# Patient Record
Sex: Male | Born: 1985 | Race: White | Hispanic: No | Smoking: Former smoker
Health system: Southern US, Community
[De-identification: ages and names within clinical notes are randomized; demographics above are authoritative.]

## PROBLEM LIST (undated history)

## (undated) DIAGNOSIS — K219 Gastro-esophageal reflux disease without esophagitis: Secondary | ICD-10-CM

## (undated) DIAGNOSIS — F419 Anxiety disorder, unspecified: Secondary | ICD-10-CM

## (undated) DIAGNOSIS — F32A Depression, unspecified: Secondary | ICD-10-CM

## (undated) DIAGNOSIS — F329 Major depressive disorder, single episode, unspecified: Secondary | ICD-10-CM

## (undated) DIAGNOSIS — F1410 Cocaine abuse, uncomplicated: Secondary | ICD-10-CM

## (undated) DIAGNOSIS — F909 Attention-deficit hyperactivity disorder, unspecified type: Secondary | ICD-10-CM

## (undated) HISTORY — PX: TONSILLECTOMY: SUR1361

---
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
## 2007-08-17 ENCOUNTER — Ambulatory Visit: Payer: Self-pay | Admitting: Otolaryngology

## 2010-12-29 ENCOUNTER — Emergency Department: Payer: Self-pay | Admitting: Emergency Medicine

## 2011-01-21 ENCOUNTER — Ambulatory Visit: Payer: Self-pay | Admitting: Gastroenterology

## 2011-01-22 LAB — PATHOLOGY REPORT

## 2011-08-04 ENCOUNTER — Other Ambulatory Visit: Payer: Self-pay | Admitting: Pediatrics

## 2011-08-04 LAB — HEPATIC FUNCTION PANEL A (ARMC)
Alkaline Phosphatase: 87 U/L (ref 50–136)
Bilirubin, Direct: <0.1 mg/dL (ref 0.00–0.20)
SGOT(AST): 33 U/L (ref 15–37)
SGPT (ALT): 51 U/L
Total Protein: 7.3 g/dL (ref 6.4–8.2)

## 2015-10-21 ENCOUNTER — Ambulatory Visit: Payer: Self-pay | Admitting: Family Medicine

## 2016-09-06 ENCOUNTER — Emergency Department
Admission: EM | Admit: 2016-09-06 | Discharge: 2016-09-07 | Disposition: A | Discharge status: Home / Self Care | Payer: Medicaid Other | Attending: Emergency Medicine | Admitting: Emergency Medicine

## 2016-09-06 DIAGNOSIS — F1410 Cocaine abuse, uncomplicated: Secondary | ICD-10-CM | POA: Diagnosis not present

## 2016-09-06 DIAGNOSIS — Z5181 Encounter for therapeutic drug level monitoring: Secondary | ICD-10-CM | POA: Diagnosis not present

## 2016-09-06 DIAGNOSIS — F17200 Nicotine dependence, unspecified, uncomplicated: Secondary | ICD-10-CM | POA: Diagnosis not present

## 2016-09-06 DIAGNOSIS — R45851 Suicidal ideations: Secondary | ICD-10-CM | POA: Diagnosis present

## 2016-09-06 HISTORY — DX: Cocaine abuse, uncomplicated: F14.10

## 2016-09-06 LAB — COMPREHENSIVE METABOLIC PANEL
ALBUMIN: 5.2 g/dL — AB (ref 3.5–5.0)
ALT: 31 U/L (ref 17–63)
ANION GAP: 11 (ref 5–15)
AST: 26 U/L (ref 15–41)
Alkaline Phosphatase: 85 U/L (ref 38–126)
BUN: 16 mg/dL (ref 6–20)
CHLORIDE: 100 mmol/L — AB (ref 101–111)
CO2: 24 mmol/L (ref 22–32)
Calcium: 9.7 mg/dL (ref 8.9–10.3)
Creatinine, Ser: 1.15 mg/dL (ref 0.61–1.24)
GFR calc non Af Amer: >60 mL/min (ref >60)
GLUCOSE: 100 mg/dL — AB (ref 65–99)
Potassium: 3.4 mmol/L — ABNORMAL LOW (ref 3.5–5.1)
SODIUM: 135 mmol/L (ref 135–145)
Total Bilirubin: 1.1 mg/dL (ref 0.3–1.2)
Total Protein: 8.3 g/dL — ABNORMAL HIGH (ref 6.5–8.1)

## 2016-09-06 LAB — CBC
HEMATOCRIT: 45.1 % (ref 40.0–52.0)
Hemoglobin: 15.9 g/dL (ref 13.0–18.0)
MCH: 29.2 pg (ref 26.0–34.0)
MCHC: 35.3 g/dL (ref 32.0–36.0)
MCV: 82.8 fL (ref 80.0–100.0)
Platelets: 304 10*3/uL (ref 150–440)
RBC: 5.44 MIL/uL (ref 4.40–5.90)
RDW: 13.4 % (ref 11.5–14.5)
WBC: 10.4 10*3/uL (ref 3.8–10.6)

## 2016-09-06 LAB — URINE DRUG SCREEN, QUALITATIVE (ARMC ONLY)
AMPHETAMINES, UR SCREEN: NOT DETECTED
Barbiturates, Ur Screen: NOT DETECTED
Benzodiazepine, Ur Scrn: NOT DETECTED
COCAINE METABOLITE, UR ~~LOC~~: POSITIVE — AB
Cannabinoid 50 Ng, Ur ~~LOC~~: NOT DETECTED
MDMA (ECSTASY) UR SCREEN: NOT DETECTED
METHADONE SCREEN, URINE: NOT DETECTED
Opiate, Ur Screen: NOT DETECTED
Phencyclidine (PCP) Ur S: NOT DETECTED
TRICYCLIC, UR SCREEN: NOT DETECTED

## 2016-09-06 LAB — ETHANOL: Alcohol, Ethyl (B): <5 mg/dL (ref <5)

## 2016-09-06 LAB — SALICYLATE LEVEL

## 2016-09-06 LAB — ACETAMINOPHEN LEVEL

## 2016-09-06 NOTE — BH Assessment (Signed)
Per EDP (Dr.Forbach) pt will be discharged and does not need TTS Consult.   Clinician spoke with pt and provided resources per EDP request.   Pt presents requesting cocaine detox services. Pt reports passive SI w/o intent and access. Pt denies h/o suicide attempt. Pt reports thoughts of self-harm with no intent. Pt denies h/o self-harm. Pt denies HI. Pt reports no hallucinations and does not appear to be responding to internal stimuli. Pt does not appear to be experiencing delusional thought content.  Pt aware of [facility] services. Pt states he has court date and is unable to admit self to [facility] until after court.  Pt denies alcohol use. Pt denies additional substance use. Pt aware of lack of inpatient detox services for sole cocaine use. Pt provided with information and resources for outpatient SA services, including [facility] intensive outpatient services.

## 2016-09-06 NOTE — ED Triage Notes (Signed)
Pt arrives to ER via POV for detox from cocaine. Pt reports that he was at [facility] to get help and that would not allow him because he has a court date coming up. PT last use of cocaine was "a couple hours ago". Denies any other drugs or alcohol use. Pt reports thoughts of harming himself, but denies HI. No hx or prior attempts of SI.

## 2016-09-06 NOTE — ED Notes (Signed)
Pt in hallway at this time.

## 2016-09-06 NOTE — ED Provider Notes (Signed)
[facility] Emergency Department Provider Note  ____________________________________________   First MD Initiated Contact with Patient 09/06/16 2211     (approximate)  I have reviewed the triage vital signs and the nursing notes.   HISTORY  Chief Complaint Drug Problem and Suicidal    HPI Benjamin Crane. is a 31 y.o. male with a history of cocaine abuse who presents asking for help with detox from cocaine.  He states that his last usage was a few hours ago.  He says that it has been gradually worsening over the last few weeks but that he has had a problem for a long time.  It is affecting his life and he is losing his family.  He reports depression but denies any suicidal ideation and denies homicidal ideation.  He states that he tried to go to [facility] but they would not take him because he has a court date coming up in a couple of days and said that they would not take him until afterwards.  He feels like he does not have control over his habit.  He denies any recent illnesses or symptoms as documented in the review of systems below.  He states his problem is severe and nothing is helping.   Past Medical History:  Diagnosis Date  . Cocaine abuse     There are no active problems to display for this patient.   History reviewed. No pertinent surgical history.  Prior to Admission medications   Not on File    Allergies Patient has no known allergies.  History reviewed. No pertinent family history.  Social History Social History  Substance Use Topics  . Smoking status: Current Every Day Smoker  . Smokeless tobacco: Never Used  . Alcohol use No    Review of Systems Constitutional: No fever/chills Eyes: No visual changes. ENT: No sore throat. Cardiovascular: Denies chest pain. Respiratory: Denies shortness of breath. Gastrointestinal: No abdominal pain.  No nausea, no vomiting.  No diarrhea.  No constipation. Genitourinary: Negative for  dysuria. Musculoskeletal: Negative for back pain. Skin: Negative for rash. Neurological: Negative for headaches, focal weakness or numbness. Psych:  depressed due to his cocaine abuse, but denies SI/HI  10-point ROS otherwise negative.  ____________________________________________   PHYSICAL EXAM:  VITAL SIGNS: ED Triage Vitals  Enc Vitals Group     BP 09/06/16 2040 123/87     Pulse Rate 09/06/16 2040 75     Resp 09/06/16 2040 18     Temp 09/06/16 2040 98.1 F (36.7 C)     Temp Source 09/06/16 2040 Oral     SpO2 09/06/16 2147 100 %     Weight 09/06/16 2040 160 lb (72.6 kg)     Height 09/06/16 2040 5\' 10"  (1.778 m)     Head Circumference --      Peak Flow --      Pain Score --      Pain Loc --      Pain Edu? --      Excl. in GC? --     Constitutional: Alert and oriented. Disheveled but generally well-appearing Eyes: Conjunctivae are normal. PERRL. EOMI. Head: Atraumatic. Nose: No congestion/rhinnorhea. Mouth/Throat: Mucous membranes are moist. Neck: No stridor.  No meningeal signs.   Cardiovascular: Normal rate, regular rhythm. Good peripheral circulation. Grossly normal heart sounds. Respiratory: Normal respiratory effort.  No retractions. Lungs CTAB. Gastrointestinal: Soft and nontender. No distention.  Musculoskeletal: No lower extremity tenderness nor edema. No gross deformities of extremities.  Neurologic:  Normal speech and language. No gross focal neurologic deficits are appreciated.  Skin:  Skin is warm, dry and intact. No rash noted. Psychiatric: Mood and affect are normal. Speech and behavior are normal. Denies SI/HI  ____________________________________________   LABS (all labs ordered are listed, but only abnormal results are displayed)  Labs Reviewed  COMPREHENSIVE METABOLIC PANEL - Abnormal; Notable for the following:       Result Value   Potassium 3.4 (*)    Chloride 100 (*)    Glucose, Bld 100 (*)    Total Protein 8.3 (*)    Albumin 5.2 (*)     All other components within normal limits  ACETAMINOPHEN LEVEL - Abnormal; Notable for the following:    Acetaminophen (Tylenol), Serum <10 (*)    All other components within normal limits  URINE DRUG SCREEN, QUALITATIVE ([facility] ONLY) - Abnormal; Notable for the following:    Cocaine Metabolite,Ur [name] POSITIVE (*)    All other components within normal limits  ETHANOL  SALICYLATE LEVEL  CBC   ____________________________________________  EKG  None - EKG not ordered by ED physician ____________________________________________  RADIOLOGY   No results found.  ____________________________________________   PROCEDURES  Procedure(s) performed:   Procedures   Critical Care performed: No ____________________________________________   INITIAL IMPRESSION / ASSESSMENT AND PLAN / ED COURSE  Pertinent labs & imaging results that were available during my care of the patient were reviewed by me and considered in my medical decision making (see chart for details).  The patient is denying suicidal ideation and homicidal ideation on 2 different occasions during the interview in which I ask him.  He admits that he has a problem and has good insight into it.  He does not seem to have the ability to control himself and wants some sort of inpatient treatment for assistance.  I explained that [facility] is where we usually send people for help with his kind of problem.  I asked Margaretmary LombardFatima with [facility] to come see the patient and offer some recommendations and outpatient options.  There is no indication for inpatient treatment or involuntary commitment at this time, and he does not represent an acute danger to himself nor others.      ____________________________________________  FINAL CLINICAL IMPRESSION(S) / ED DIAGNOSES  Final diagnoses:  Cocaine abuse     MEDICATIONS GIVEN DURING THIS VISIT:  Medications - No data to display   NEW OUTPATIENT MEDICATIONS STARTED DURING THIS VISIT:  New  Prescriptions   No medications on file    Modified Medications   No medications on file    Discontinued Medications   No medications on file     Note:  This document was prepared using Dragon voice recognition software and may include unintentional dictation errors.    Loleta Roseory Jahmeek Shirk, MD 09/06/16 2258

## 2016-09-06 NOTE — Discharge Instructions (Signed)
You have been seen in the Emergency Department (ED) today for substance abuse.  We do not treat cocaine abuse in this hospital, but encourage you to follow up with [facility] (as you already tried to do) or even with [facility] who can help with mental health services.  You were also given additional community resources such as in [place] [facility] as well as intensive [facility] resources [facility].  We encourage you to seek additional treatment with those options.  Please return to the ED immediately if you have ANY thoughts of hurting yourself or anyone else, so that we may help you.  Please avoid alcohol and drug use.  Follow up with your doctor and/or therapist as soon as possible regarding today's ED  visit.   Please follow up any other recommendations and clinic appointments provided by the psychiatry team that saw you in the Emergency Department.

## 2016-09-06 NOTE — ED Notes (Addendum)
Pt given phone to call ride, due to D/C. Pt given Estate manager/land agentresource materials for behavioral and detox services. Pt provided personal belongings in front of ODS officer. Pt changed out of purple scrubs and into personal clothing. Will D/C once ride is at ED.

## 2016-09-22 ENCOUNTER — Emergency Department
Admission: EM | Admit: 2016-09-22 | Discharge: 2016-09-23 | Disposition: A | Discharge status: Home / Self Care | Payer: Medicaid Other | Attending: Student in an Organized Health Care Education/Training Program | Admitting: Student in an Organized Health Care Education/Training Program

## 2016-09-22 ENCOUNTER — Encounter: Payer: Self-pay | Admitting: Emergency Medicine

## 2016-09-22 DIAGNOSIS — F22 Delusional disorders: Secondary | ICD-10-CM | POA: Diagnosis not present

## 2016-09-22 DIAGNOSIS — F918 Other conduct disorders: Secondary | ICD-10-CM | POA: Insufficient documentation

## 2016-09-22 DIAGNOSIS — F15950 Other stimulant use, unspecified with stimulant-induced psychotic disorder with delusions: Secondary | ICD-10-CM

## 2016-09-22 DIAGNOSIS — R45850 Homicidal ideations: Secondary | ICD-10-CM

## 2016-09-22 DIAGNOSIS — F1520 Other stimulant dependence, uncomplicated: Secondary | ICD-10-CM

## 2016-09-22 DIAGNOSIS — F1410 Cocaine abuse, uncomplicated: Secondary | ICD-10-CM

## 2016-09-22 DIAGNOSIS — F1910 Other psychoactive substance abuse, uncomplicated: Secondary | ICD-10-CM

## 2016-09-22 DIAGNOSIS — F17200 Nicotine dependence, unspecified, uncomplicated: Secondary | ICD-10-CM | POA: Diagnosis not present

## 2016-09-22 DIAGNOSIS — R4689 Other symptoms and signs involving appearance and behavior: Secondary | ICD-10-CM

## 2016-09-22 DIAGNOSIS — Z79899 Other long term (current) drug therapy: Secondary | ICD-10-CM | POA: Diagnosis not present

## 2016-09-22 DIAGNOSIS — F1510 Other stimulant abuse, uncomplicated: Secondary | ICD-10-CM

## 2016-09-22 LAB — COMPREHENSIVE METABOLIC PANEL
ALT: 25 U/L (ref 17–63)
AST: 26 U/L (ref 15–41)
Albumin: 5.2 g/dL — ABNORMAL HIGH (ref 3.5–5.0)
Alkaline Phosphatase: 87 U/L (ref 38–126)
Anion gap: 11 (ref 5–15)
BILIRUBIN TOTAL: 1.1 mg/dL (ref 0.3–1.2)
BUN: 21 mg/dL — AB (ref 6–20)
CO2: 23 mmol/L (ref 22–32)
CREATININE: 1.23 mg/dL (ref 0.61–1.24)
Calcium: 9.8 mg/dL (ref 8.9–10.3)
Chloride: 104 mmol/L (ref 101–111)
GFR calc Af Amer: >60 mL/min (ref >60)
Glucose, Bld: 107 mg/dL — ABNORMAL HIGH (ref 65–99)
POTASSIUM: 3.1 mmol/L — AB (ref 3.5–5.1)
Sodium: 138 mmol/L (ref 135–145)
TOTAL PROTEIN: 8.3 g/dL — AB (ref 6.5–8.1)

## 2016-09-22 LAB — CBC
HCT: 47.3 % (ref 40.0–52.0)
Hemoglobin: 16.1 g/dL (ref 13.0–18.0)
MCH: 28.4 pg (ref 26.0–34.0)
MCHC: 34 g/dL (ref 32.0–36.0)
MCV: 83.7 fL (ref 80.0–100.0)
PLATELETS: 258 10*3/uL (ref 150–440)
RBC: 5.66 MIL/uL (ref 4.40–5.90)
RDW: 12.9 % (ref 11.5–14.5)
WBC: 11.2 10*3/uL — AB (ref 3.8–10.6)

## 2016-09-22 LAB — ETHANOL

## 2016-09-22 LAB — ACETAMINOPHEN LEVEL: Acetaminophen (Tylenol), Serum: <10 ug/mL — ABNORMAL LOW (ref 10–30)

## 2016-09-22 LAB — SALICYLATE LEVEL: Salicylate Lvl: <7 mg/dL (ref 2.8–30.0)

## 2016-09-22 MED ORDER — LORAZEPAM 2 MG PO TABS
2.0000 mg | ORAL_TABLET | Freq: Once | ORAL | Status: AC
  Administered 2016-09-22: 2 mg via ORAL
  Filled 2016-09-22: qty 1

## 2016-09-22 MED ORDER — LORAZEPAM 1 MG PO TABS
1.0000 mg | ORAL_TABLET | ORAL | Status: DC | PRN
  Administered 2016-09-23: 1 mg via ORAL
  Filled 2016-09-22: qty 1

## 2016-09-22 MED ORDER — HALOPERIDOL 5 MG PO TABS
5.0000 mg | ORAL_TABLET | Freq: Once | ORAL | Status: AC
  Administered 2016-09-22: 5 mg via ORAL
  Filled 2016-09-22: qty 1

## 2016-09-22 NOTE — ED Notes (Signed)
Gave pt food tray. 

## 2016-09-22 NOTE — ED Notes (Signed)
Report given to Johny SaxEric Jenson RN, pt walked over with police officer and Ed tech

## 2016-09-22 NOTE — ED Triage Notes (Signed)
[facility].  Patient's wife got IVC papers on him due to his wife stating he was hallucinating.  Patient recently released from a halfway house after he had gone from rehab.  Patient states he has recently taking methamphetamines.  Patient's wife states he facebook chatted with her through video where the patient states he saw another man in the house with her named "Fletcher".  The patient later showed up at her house with as gun and started searching for "fletcher" in the house.  Wife and others were present to verify that there was no other person in the house.

## 2016-09-22 NOTE — ED Notes (Signed)
Pt is alert but somewhat disoriented on admission. Pt is aware of person place and situation but environment is ever changing. Pt sees movement and bizarre shapes in environment. Pt affect is also bizarre and he is disorganized as well. Pt unable to distinguish reality, but he is pleasant and cooperative with staff. Writer oriented pt to the milieu and encouraged rest. Writer also suggested that perpetrator in home may have been a hallucination brought on by lack of sleep and methamphetamine abuse. Pt does not disagree, but cannot think rationally. Writer provided food and drink and 15 minute checks ongoing for safety. He went to sleep shortly after arrival, and stated he had been up for 4 days without rest.

## 2016-09-22 NOTE — BH Assessment (Signed)
Clinician informed by Minerva AreolaEric, RN that pt is currently asleep. RN requested clinician not wake pt, explaining that pt reported lack of sleep x4 days and that it is not in the pt's best interest at this time. Pt to be assessed at later time.

## 2016-09-22 NOTE — ED Provider Notes (Signed)
[facility] Emergency Department Provider Note    First MD Initiated Contact with Patient 09/22/16 1719     (approximate)  I have reviewed the triage vital signs and the nursing notes.   HISTORY  Chief Complaint Homicidal and Hallucinations    HPI Benjamin Crane. is a 31 y.o. male who presents via shifted PEs with IVC due to paranoia and homicidal ideation. Patient provides very long history about today's events regarding his worry that his wife is currently a prostitute and sleeping with multiple other men but one has been in his house. States that the name and his pain is "Benjamin Crane "and that he was on his bed with his wife while the patient was talking to his wife via Facebook video. States that throughout the day became more anxious drug to the house to find them. So known in the house. Talk to his wife who states that there is no notes in the house. He states that when the wife was driving away he thinks that he saw someone climbing over the seats and getting in the passenger seat.  He then thinks that later today when he is again talking to his wife that he heard male voice in the house, he grabbed his gun and was walking around the room searching for him because he wanted to "get him "   Past Medical History:  Diagnosis Date  . Cocaine abuse    No family history on file. Past Surgical History:  Procedure Laterality Date  . TONSILLECTOMY     There are no active problems to display for this patient.     Prior to Admission medications   Not on File    Allergies Patient has no known allergies.    Social History Social History  Substance Use Topics  . Smoking status: Current Every Day Smoker  . Smokeless tobacco: Never Used  . Alcohol use No    Review of Systems Patient denies headaches, rhinorrhea, blurry vision, numbness, shortness of breath, chest pain, edema, cough, abdominal pain, nausea, vomiting, diarrhea, dysuria, fevers,  rashes or hallucinations unless otherwise stated above in HPI. ____________________________________________   PHYSICAL EXAM:  VITAL SIGNS: Vitals:   09/22/16 1659  BP: (!) 140/99  Pulse: (!) 116  Resp: 20  Temp: 98 F (36.7 C)    Constitutional: Alert agitated and anxious, pacing about room Eyes: Conjunctivae are normal. PERRL. EOMI. Head: Atraumatic. Nose: No congestion/rhinnorhea. Mouth/Throat: Mucous membranes are moist.  Oropharynx non-erythematous. Neck: No stridor. Painless ROM. No cervical spine tenderness to palpation Hematological/Lymphatic/Immunilogical: No cervical lymphadenopathy. Cardiovascular: Normal rate, regular rhythm. Grossly normal heart sounds.  Good peripheral circulation. Respiratory: Normal respiratory effort.  No retractions. Lungs CTAB. Gastrointestinal: Soft and nontender. No distention. No abdominal bruits. No CVA tenderness. Musculoskeletal: No lower extremity tenderness nor edema.  No joint effusions. Neurologic:  Normal speech and language. No gross focal neurologic deficits are appreciated. No gait instability. Skin:  Skin is warm, dry and intact. No rash noted. Psychiatric: anxious, oressured speech ____________________________________________   LABS (all labs ordered are listed, but only abnormal results are displayed)  Results for orders placed or performed during the hospital encounter of 09/22/16 (from the past 24 hour(s))  Comprehensive metabolic panel     Status: Abnormal   Collection Time: 09/22/16  5:07 PM  Result Value Ref Range   Sodium 138 135 - 145 mmol/L   Potassium 3.1 (L) 3.5 - 5.1 mmol/L   Chloride 104 101 - 111 mmol/L  CO2 23 22 - 32 mmol/L   Glucose, Bld 107 (H) 65 - 99 mg/dL   BUN 21 (H) 6 - 20 mg/dL   Creatinine, Ser 1.611.23 0.61 - 1.24 mg/dL   Calcium 9.8 8.9 - 09.610.3 mg/dL   Total Protein 8.3 (H) 6.5 - 8.1 g/dL   Albumin 5.2 (H) 3.5 - 5.0 g/dL   AST 26 15 - 41 U/L   ALT 25 17 - 63 U/L   Alkaline Phosphatase 87 38 -  126 U/L   Total Bilirubin 1.1 0.3 - 1.2 mg/dL   GFR calc non Af Amer >60 >60 mL/min   GFR calc Af Amer >60 >60 mL/min   Anion gap 11 5 - 15  Ethanol     Status: None   Collection Time: 09/22/16  5:07 PM  Result Value Ref Range   Alcohol, Ethyl (B) <5 <5 mg/dL  Salicylate level     Status: None   Collection Time: 09/22/16  5:07 PM  Result Value Ref Range   Salicylate Lvl <7.0 2.8 - 30.0 mg/dL  Acetaminophen level     Status: Abnormal   Collection Time: 09/22/16  5:07 PM  Result Value Ref Range   Acetaminophen (Tylenol), Serum <10 (L) 10 - 30 ug/mL  cbc     Status: Abnormal   Collection Time: 09/22/16  5:07 PM  Result Value Ref Range   WBC 11.2 (H) 3.8 - 10.6 K/uL   RBC 5.66 4.40 - 5.90 MIL/uL   Hemoglobin 16.1 13.0 - 18.0 g/dL   HCT 04.547.3 40.940.0 - 81.152.0 %   MCV 83.7 80.0 - 100.0 fL   MCH 28.4 26.0 - 34.0 pg   MCHC 34.0 32.0 - 36.0 g/dL   RDW 91.412.9 78.211.5 - 95.614.5 %   Platelets 258 150 - 440 K/uL   ____________________________________________  EKG____________________________________________  RADIOLOGY   ____________________________________________   PROCEDURES  Procedure(s) performed:  Procedures    Critical Care performed: no ____________________________________________   INITIAL IMPRESSION / ASSESSMENT AND PLAN / ED COURSE  Pertinent labs & imaging results that were available during my care of the patient were reviewed by me and considered in my medical decision making (see chart for details).  DDX: Psychosis, delirium, medication effect, noncompliance, polysubstance abuse, Si, Hi, depression   Benjamin MassonLawrence L Mayor Jr. is a 31 y.o. who presents to the ED with for evaluation of HI and parnaoia.  Patient has psych history of depression and substance abuse.  Laboratory testing was ordered to evaluation for underlying electrolyte derangement or signs of underlying organic pathology to explain today's presentation.  Based on history and physical and laboratory evaluation,  it appears that the patient's presentation is 2/2 underlying psychiatric disorder and will require further evaluation and management by inpatient psychiatry.  Patient was  made an IVC due to paranoia and HI with access to weapons.  Disposition pending psychiatric evaluation.       ____________________________________________   FINAL CLINICAL IMPRESSION(S) / ED DIAGNOSES  Final diagnoses:  Homicidal ideations  Aggressive behavior  Paranoia (HCC)      NEW MEDICATIONS STARTED DURING THIS VISIT:  New Prescriptions   No medications on file     Note:  This document was prepared using Dragon voice recognition software and may include unintentional dictation errors.    Willy EddyPatrick Kyley Laurel, MD 09/22/16 Paulo [id number]

## 2016-09-23 DIAGNOSIS — F1410 Cocaine abuse, uncomplicated: Secondary | ICD-10-CM

## 2016-09-23 DIAGNOSIS — F1510 Other stimulant abuse, uncomplicated: Secondary | ICD-10-CM

## 2016-09-23 DIAGNOSIS — F1520 Other stimulant dependence, uncomplicated: Secondary | ICD-10-CM

## 2016-09-23 DIAGNOSIS — F15950 Other stimulant use, unspecified with stimulant-induced psychotic disorder with delusions: Secondary | ICD-10-CM

## 2016-09-23 NOTE — ED Notes (Signed)
Patient is sleeping, respirations even and unlabored, q 15 minute checks and camera surveillance in progress. q 15 minute , will continue to monitor.

## 2016-09-23 NOTE — BH Assessment (Signed)
Assessment Note  Benjamin Crane. is an 31 y.o. male who presents to the ER due to going to his home, with a gun, because he saw someone in the background while he was "video chatting with her (wife) on FaceBook." According to the ER notes, when he arrived to the home, the wife was there with family members. The individual he thought he had seen wasn't there and family reaffirmed, no one else was there but them.  Patient admits to abusing substances. He reports of abusing "Cocaine and Crystal Meth." His most recent use was several days ago. Due to his use, he hadn't slept in several days. He states, he believes he was seeing things because of the lack of sleep and drug use.  During the interview, he was calm, cooperative and pleasant. He was orient to person, place and situation. He states he is unsure of he have any legal charges, due to the incident that took place last night (09/22/2016). As well as, from a recent "drug binge, I was getting high with an undercover cop."  Past Medical History:  Past Medical History:  Diagnosis Date  . Cocaine abuse     Past Surgical History:  Procedure Laterality Date  . TONSILLECTOMY      Family History: No family history on file.  Social History:  reports that he has been smoking.  He has never used smokeless tobacco. He reports that he uses drugs, including Cocaine and Methamphetamines. He reports that he does not drink alcohol.  Additional Social History:  Alcohol / Drug Use Pain Medications: See PTA Prescriptions: See PTA Over the Counter: See PTA History of alcohol / drug use?: Yes Longest period of sobriety (when/how long): Unable to Quantify  Negative Consequences of Use: Legal, Personal relationships, Financial, Work / School Withdrawal Symptoms:  (n/a) Substance #1 Name of Substance 1: "Crystal Meth" Substance #2 Name of Substance 2: Cocaine  CIWA: CIWA-Ar BP: (!) 99/57 Pulse Rate: 76 COWS:    Allergies: No Known  Allergies  Home Medications:  (Not in a hospital admission)  OB/GYN Status:  No LMP for male patient.  General Assessment Data Location of Assessment: [facility] ED TTS Assessment: In system Is this a Tele or Face-to-Face Assessment?: Face-to-Face Is this an Initial Assessment or a Re-assessment for this encounter?: Initial Assessment Marital status: Married [place] name: n/a Is patient pregnant?: No Pregnancy Status: No Living Arrangements: Spouse/significant other, Other (Comment) (Recently was in [employer]") Can pt return to current living arrangement?: No Admission Status: Involuntary Is patient capable of signing voluntary admission?: No (Under IVC) Referral Source: Self/Family/Friend Insurance type: n/a  Medical Screening Exam [facility] Walk-in ONLY) Medical Exam completed: Yes  Crisis Care Plan Living Arrangements: Spouse/significant other, Other (Comment) (Recently was in "[employer]") Legal Guardian: Other: (Self) Name of Psychiatrist: Reports of none Name of Therapist: Reports of none  Education Status Is patient currently in school?: No Current Grade: n/a Highest grade of school patient has completed: n/a Name of school: n/a Contact person: n/a  Risk to self with the past 6 months Suicidal Ideation: No Has patient been a risk to self within the past 6 months prior to admission? : No Suicidal Intent: No Has patient had any suicidal intent within the past 6 months prior to admission? : No Is patient at risk for suicide?: No Suicidal Plan?: No Has patient had any suicidal plan within the past 6 months prior to admission? : No Access to Means: No What has been your  use of drugs/alcohol within the last 12 months?: Methamphetamine & Cocaine Previous Attempts/Gestures: No How many times?: 0 Other Self Harm Risks: Active addiction Triggers for Past Attempts: None known Intentional Self Injurious Behavior: None Family Suicide History: No Recent stressful life  event(s): Other (Comment) (Active addiction) Persecutory voices/beliefs?: No Depression: Yes Depression Symptoms: Guilt, Fatigue, Feeling worthless/self pity, Insomnia, Isolating Substance abuse history and/or treatment for substance abuse?: Yes Suicide prevention information given to non-admitted patients: Not applicable  Risk to Others within the past 6 months Homicidal Ideation: No Does patient have any lifetime risk of violence toward others beyond the six months prior to admission? : No Thoughts of Harm to Others: No Current Homicidal Intent: No Current Homicidal Plan: No Access to Homicidal Means: No Identified Victim: Reports of none History of harm to others?: No Assessment of Violence: None Noted Violent Behavior Description: Reports of none Does patient have access to weapons?: Yes (Comment) Criminal Charges Pending?: No Does patient have a court date: No Is patient on probation?: No  Psychosis Hallucinations: None noted Delusions: None noted  Mental Status Report Appearance/Hygiene: Unremarkable, In scrubs Eye Contact: Fair Motor Activity: Freedom of movement, Unremarkable Speech: Logical/coherent, Soft, Unremarkable Level of Consciousness: Alert Mood: Anxious, Helpless, Sad, Pleasant Affect: Sad, Appropriate to circumstance, Depressed Anxiety Level: Minimal Thought Processes: Coherent, Relevant Judgement: Unimpaired Orientation: Person, Place, Time, Situation, Appropriate for developmental age Obsessive Compulsive Thoughts/Behaviors: Minimal  Cognitive Functioning Concentration: Normal Memory: Recent Intact, Remote Intact IQ: Average Insight: Fair Impulse Control: Fair Appetite: Fair Weight Loss: 0 Weight Gain: 0 Sleep: Decreased Total Hours of Sleep: 8 Vegetative Symptoms: None  ADLScreening [facility] Assessment Services) Patient's cognitive ability adequate to safely complete daily activities?: Yes Patient able to express need for assistance with  ADLs?: Yes Independently performs ADLs?: Yes (appropriate for developmental age)  Prior Inpatient Therapy Prior Inpatient Therapy: Yes Prior Therapy Dates: Unable to remember the dates [facility] Reason for Treatment: Substance Abuse  Prior Outpatient Therapy Prior Outpatient Therapy: No Prior Therapy Facilty/Provider(s): Reports of none Reason for Treatment: Reports of none Does patient have an ACCT team?: No Does patient have Intensive In-House Services?  : No Does patient have [facility] services? : No Does patient have P4CC services?: No  ADL Screening (condition at time of admission) Patient's cognitive ability adequate to safely complete daily activities?: Yes Is the patient deaf or have difficulty hearing?: No Does the patient have difficulty seeing, even when wearing glasses/contacts?: No Does the patient have difficulty concentrating, remembering, or making decisions?: No Patient able to express need for assistance with ADLs?: Yes Does the patient have difficulty dressing or bathing?: No Independently performs ADLs?: Yes (appropriate for developmental age) Does the patient have difficulty walking or climbing stairs?: No Weakness of Legs: None Weakness of Arms/Hands: None  Home Assistive Devices/Equipment Home Assistive Devices/Equipment: None  Therapy Consults (therapy consults require a physician order) PT Evaluation Needed: No OT Evalulation Needed: No SLP Evaluation Needed: No Abuse/Neglect Assessment (Assessment to be complete while patient is alone) Physical Abuse: Denies Verbal Abuse: Denies Sexual Abuse: Denies Exploitation of patient/patient's resources: Denies Self-Neglect: Denies Values / Beliefs Cultural Requests During Hospitalization: None Spiritual Requests During Hospitalization: None Consults Spiritual Care Consult Needed: No Social Work Consult Needed: No Merchant navy officerAdvance Directives (For Healthcare) Does Patient  Have a Medical Advance Directive?: No    Additional Information 1:1 In Past 12 Months?: No CIRT Risk: No Elopement Risk: No Does patient have medical clearance?: Yes  Child/Adolescent Assessment Running Away  Risk: Denies (Patient is an adult)  Disposition:  Disposition Initial Assessment Completed for this Encounter: Yes Disposition of Patient: Other dispositions (ER MD Ordered Psych Consult)  On Site Evaluation by:   Reviewed with Physician:    Lilyan Gilford MS, LCAS, LPC, NCC, CCSI Therapeutic Triage Specialist 09/23/2016 11:42 AM

## 2016-09-23 NOTE — ED Notes (Signed)
Nurse took breakfast in room and patient grunted and went back to sleep, no evidence of distress.

## 2016-09-23 NOTE — ED Notes (Signed)
Nurse had given patient his lunch tray with a beverage, but he has not touched it, nurse talked to him about eating and drinking and he states that he only wants to sleep, denies Si/hi or avh, nurse let him know that Dr. Toni Amendlapacs would come back and talk to him, q 15 minute checks and camera surveillance in progress.

## 2016-09-23 NOTE — ED Notes (Signed)
Patient with discharge orders, Patient voices understanding of discharge, he has a ride to transfer home, Patient's belongings given back to him. Patient without any signs of distress.

## 2016-09-23 NOTE — Discharge Instructions (Signed)
Please seek medical attention and help for any thoughts about wanting to harm yourself, harm others, any concerning change in behavior, severe depression, inappropriate drug use or any other new or concerning symptoms. ° °

## 2016-09-23 NOTE — ED Notes (Signed)
Dr. Toni Amendclapacs in to see Patient, but Patient does not want to wake up and talk at this time, Dr. Toni Amendlapacs to return. Patient with q 15 minute checks and camera surveillance in progress.

## 2016-09-23 NOTE — ED Notes (Signed)
Pt still exhibiting dystonic movements and having difficulty following simple instructions but he did sleep throughout the night and responds to reinforcement/repetition.

## 2016-09-23 NOTE — ED Notes (Signed)
Dr. Toni Amendlapacs talking to patient.

## 2016-09-23 NOTE — ED Notes (Signed)
Pt almost fell out of bed. Writer woke pt for safety. Pt is disoriented, pacing and jittery, but is redirectable. Writer administered medication and fluids for anxiety/sleep and reoriented pt to time place and situation. Pt responded well to redirection and returned to bed.

## 2016-09-23 NOTE — ED Provider Notes (Signed)
-----------------------------------------   4:34 PM on 09/23/2016 -----------------------------------------  Dr. Toni Amendlapacs with psychiatry has evaluated the patient. He has rescinded the IVC. Feels patient is safe for discharge.   Benjamin SemenGraydon Jocie Meroney, MD 09/23/16 [phone]

## 2016-09-23 NOTE — ED Notes (Signed)
Nurse talked with patient and he admits that he has a problem with drugs and wants to change His way of living, He states " I started doing cocaine when I was 31 years old and I try to stop, but I keep going back to it, talked about his 29 year old daughter and wife. Patient has attended AA, says that he has a job. Patient denies Si/hi and avh, He hopes to go home and start living a new life, states that he has support of family and will continue to go to AA. Nurse also told him about the program [facility] and would like that information. q 15 minute checks and camera surveillance in progress.

## 2016-09-23 NOTE — Consult Note (Signed)
[facility] Consult   Reason for Consult:  Consult for 31 year old man brought in by police after agitated bizarre behavior in the community Referring Physician:  Quale Patient Identification: Benjamin Crane. MRN:  [id number] Principal Diagnosis: Amphetamine and psychostimulant-induced psychosis with delusions ([place]) Diagnosis:   Patient Active Problem List   Diagnosis Date Noted  . Amphetamine and psychostimulant-induced psychosis with delusions ([place]) [F15.950] 09/23/2016  . Amphetamine abuse [F15.10] 09/23/2016  . Cocaine abuse [F14.10] 09/23/2016    Total Time spent with patient: 1 hour  Subjective:   Benjamin Alcorn. is a 31 y.o. male patient admitted with "using drugs and not sleeping".  HPI:  Patient states that he had been on a methamphetamine binge for about 3 days and hadn't slept much since the weekend. Admits that his mood had been somewhat irritable. He has only vague memories of yesterday. Commitment paperwork and police reports state that he had been very agitated according to his wife claiming that she had min in the house that she was sleeping with. He was apparently running around with a gun saying that he was going to find an shoot the man that his wife was with. Wife evidently has claimed there was no other man around at all. On interview today the patient seems to have about halfway insight into this. He understands that he was being a little paranoid and crazy running around although he says he still thinks that his wife had someone in the back seat of her car. Patient admits that his mood gets out of control when he is using drugs. Today he says he is feeling fine. Denies any suicidal or homicidal thoughts at all. Denies any auditory or visual hallucinations. Indicates that he plans to try to get back into the [employer] and continue working. All. Denies any use of alcohol  Social history: He is married but apparently recently has been  staying in an [place] because of his substance abuse behavior. Unclear if he can go back there right now. 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  Medical history: No known significant medical problems  Substance abuse history: He says he's been in a large number of detox and rehabilitation programs of all sorts. Has had drug problems going back years including amphetamine and cocaine.  Past Psychiatric History: Patient says he has never had any kind of psychiatric treatment except for substance abuse although he then says that Dr. Alice Reichert prescribed him Lexapro and buspar. He says he's never been in a psychiatric hospital and has never tried to kill himself  Risk to Self: Suicidal Ideation: No Suicidal Intent: No Is patient at risk for suicide?: No Suicidal Plan?: No Access to Means: No What has been your use of drugs/alcohol within the last 12 months?: Methamphetamine & Cocaine How many times?: 0 Other Self Harm Risks: Active addiction Triggers for Past Attempts: None known Intentional Self Injurious Behavior: None Risk to Others: Homicidal Ideation: No Thoughts of Harm to Others: No Current Homicidal Intent: No Current Homicidal Plan: No Access to Homicidal Means: No Identified Victim: Reports of none History of harm to others?: No Assessment of Violence: None Noted Violent Behavior Description: Reports of none Does patient have access to weapons?: Yes (Comment) Criminal Charges Pending?: No Does patient have a court date: No Prior Inpatient Therapy: Prior Inpatient Therapy: Yes Prior Therapy Dates: Unable to remember the dates [facility] Reason for Treatment: Substance Abuse Prior Outpatient Therapy: Prior Outpatient Therapy: No  Prior Therapy Facilty/Provider(s): Reports of none Reason for Treatment: Reports of none Does patient have an ACCT team?: No Does patient have Intensive In-House Services?  : No Does patient have [facility]  services? : No Does patient have P4CC services?: No  Past Medical History:  Past Medical History:  Diagnosis Date  . Cocaine abuse     Past Surgical History:  Procedure Laterality Date  . TONSILLECTOMY     Family History: No family history on file. Family Psychiatric  History: Denies being aware of any Social History:  History  Alcohol Use No     History  Drug Use  . Types: Cocaine, Methamphetamines    Social History   Social History  . Marital status: Single    Spouse name: N/A  . Number of children: N/A  . Years of education: N/A   Social History Main Topics  . Smoking status: Current Every Day Smoker  . Smokeless tobacco: Never Used  . Alcohol use No  . Drug use: Yes    Types: Cocaine, Methamphetamines  . Sexual activity: Not Asked   Other Topics Concern  . None   Social History Narrative  . None   Additional Social History:    Allergies:  No Known Allergies  Labs:  Results for orders placed or performed during the hospital encounter of 09/22/16 (from the past 48 hour(s))  Comprehensive metabolic panel     Status: Abnormal   Collection Time: 09/22/16  5:07 PM  Result Value Ref Range   Sodium 138 135 - 145 mmol/L   Potassium 3.1 (L) 3.5 - 5.1 mmol/L   Chloride 104 101 - 111 mmol/L   CO2 23 22 - 32 mmol/L   Glucose, Bld 107 (H) 65 - 99 mg/dL   BUN 21 (H) 6 - 20 mg/dL   Creatinine, Ser 1.23 0.61 - 1.24 mg/dL   Calcium 9.8 8.9 - 10.3 mg/dL   Total Protein 8.3 (H) 6.5 - 8.1 g/dL   Albumin 5.2 (H) 3.5 - 5.0 g/dL   AST 26 15 - 41 U/L   ALT 25 17 - 63 U/L   Alkaline Phosphatase 87 38 - 126 U/L   Total Bilirubin 1.1 0.3 - 1.2 mg/dL   GFR calc non Af Amer >60 >60 mL/min   GFR calc Af Amer >60 >60 mL/min    Comment: (NOTE) The eGFR has been calculated using the CKD EPI equation. This calculation has not been validated in all clinical situations. eGFR's persistently <60 mL/min signify possible Chronic Kidney Disease.    Anion gap 11 5 - 15  Ethanol      Status: None   Collection Time: 09/22/16  5:07 PM  Result Value Ref Range   Alcohol, Ethyl (B) <5 <5 mg/dL    Comment:        LOWEST DETECTABLE LIMIT FOR SERUM ALCOHOL IS 5 mg/dL FOR MEDICAL PURPOSES ONLY   Salicylate level     Status: None   Collection Time: 09/22/16  5:07 PM  Result Value Ref Range   Salicylate Lvl <1.0 2.8 - 30.0 mg/dL  Acetaminophen level     Status: Abnormal   Collection Time: 09/22/16  5:07 PM  Result Value Ref Range   Acetaminophen (Tylenol), Serum <10 (L) 10 - 30 ug/mL    Comment:        THERAPEUTIC CONCENTRATIONS VARY SIGNIFICANTLY. A RANGE OF 10-30 ug/mL MAY BE AN EFFECTIVE CONCENTRATION FOR MANY PATIENTS. HOWEVER, SOME ARE BEST TREATED AT CONCENTRATIONS OUTSIDE THIS RANGE. ACETAMINOPHEN  CONCENTRATIONS >150 ug/mL AT 4 HOURS AFTER INGESTION AND >50 ug/mL AT 12 HOURS AFTER INGESTION ARE OFTEN ASSOCIATED WITH TOXIC REACTIONS.   cbc     Status: Abnormal   Collection Time: 09/22/16  5:07 PM  Result Value Ref Range   WBC 11.2 (H) 3.8 - 10.6 K/uL   RBC 5.66 4.40 - 5.90 MIL/uL   Hemoglobin 16.1 13.0 - 18.0 g/dL   HCT 47.3 40.0 - 52.0 %   MCV 83.7 80.0 - 100.0 fL   MCH 28.4 26.0 - 34.0 pg   MCHC 34.0 32.0 - 36.0 g/dL   RDW 12.9 11.5 - 14.5 %   Platelets 258 150 - 440 K/uL    Current Facility-Administered Medications  Medication Dose Route Frequency Provider Last Rate Last Dose  . LORazepam (ATIVAN) tablet 1 mg  1 mg Oral Q4H PRN Merlyn Lot, MD   1 mg at 09/23/16 4098   Current Outpatient Prescriptions  Medication Sig Dispense Refill  . busPIRone (BUSPAR) 15 MG tablet Take 15 mg by mouth 2 (two) times daily.    Marland Kitchen escitalopram (LEXAPRO) 20 MG tablet Take 20 mg by mouth daily.    . pantoprazole (PROTONIX) 40 MG tablet Take 40 mg by mouth daily.      Musculoskeletal: Strength & Muscle Tone: within normal limits Gait & Station: normal Patient leans: N/A  Psychiatric Specialty Exam: Physical Exam  Nursing note and vitals  reviewed. Constitutional: He appears well-developed and well-nourished.  HENT:  Head: Normocephalic and atraumatic.  Eyes: Conjunctivae are normal. Pupils are equal, round, and reactive to light.  Neck: Normal range of motion.  Cardiovascular: Regular rhythm and normal heart sounds.   Respiratory: Effort normal. No respiratory distress.  GI: Soft.  Musculoskeletal: Normal range of motion.  Neurological: He is alert.  Skin: Skin is warm and dry.  Psychiatric: He has a normal mood and affect. His speech is normal and behavior is normal. Judgment and thought content normal. Cognition and memory are normal.    Review of Systems  Constitutional: Negative.   HENT: Negative.   Eyes: Negative.   Respiratory: Negative.   Cardiovascular: Negative.   Gastrointestinal: Negative.   Musculoskeletal: Negative.   Skin: Negative.   Neurological: Negative.   Psychiatric/Behavioral: Positive for substance abuse. Negative for depression, hallucinations, memory loss and suicidal ideas. The patient has insomnia. The patient is not nervous/anxious.     Blood pressure (!) 99/57, pulse 76, temperature 97.5 F (36.4 C), temperature source Oral, resp. rate 18, height 5' 10"  (1.778 m), weight 72.6 kg (160 lb), SpO2 99 %.Body mass index is 22.96 kg/m.  General Appearance: Fairly Groomed  Eye Contact:  Fair  Speech:  Clear and Coherent  Volume:  Normal  Mood:  Euthymic  Affect:  Appropriate  Thought Process:  Goal Directed  Orientation:  Full (Time, Place, and Person)  Thought Content:  Logical  Suicidal Thoughts:  No  Homicidal Thoughts:  No  Memory:  Immediate;   Good Recent;   Fair Remote;   Fair  Judgement:  Fair  Insight:  Fair  Psychomotor Activity:  Normal  Concentration:  Concentration: Fair  Recall:  [employer] of Knowledge:  Fair  Language:  Fair  Akathisia:  No  Handed:  Right  AIMS (if indicated):     Assets:  Communication Skills Housing Physical Health  ADL's:  Intact   Cognition:  WNL  Sleep:        Treatment Plan Summary: Plan 31 year old man with amphetamine and  cocaine abuse who apparently was having a drug-induced psychosis yesterday. He has calm down and does not appear to be acutely agitated today. Not threatening or suicidal. Other than still thinking his wife had another guy and her car he is not acting bizarre or paranoid. Asian does not require inpatient hospital level treatment. He will be given information about local substance abuse facilities and educated about the dangers of continued use of stimulants. IVC can be discontinued. Case reviewed with the ER doctor and TTS.  Disposition: Patient does not meet criteria for psychiatric inpatient admission. Supportive therapy provided about ongoing stressors.  Alethia Berthold, MD 09/23/2016 3:08 PM

## 2016-11-09 ENCOUNTER — Encounter: Payer: Self-pay | Admitting: Emergency Medicine

## 2016-11-09 ENCOUNTER — Emergency Department
Admission: EM | Admit: 2016-11-09 | Discharge: 2016-11-09 | Disposition: A | Discharge status: Home / Self Care | Payer: Medicaid Other | Attending: Emergency Medicine | Admitting: Emergency Medicine

## 2016-11-09 ENCOUNTER — Emergency Department: Payer: Medicaid Other

## 2016-11-09 DIAGNOSIS — N399 Disorder of urinary system, unspecified: Secondary | ICD-10-CM

## 2016-11-09 DIAGNOSIS — M545 Low back pain, unspecified: Secondary | ICD-10-CM

## 2016-11-09 DIAGNOSIS — F17200 Nicotine dependence, unspecified, uncomplicated: Secondary | ICD-10-CM | POA: Diagnosis not present

## 2016-11-09 DIAGNOSIS — R1030 Lower abdominal pain, unspecified: Secondary | ICD-10-CM | POA: Diagnosis present

## 2016-11-09 LAB — URINALYSIS, COMPLETE (UACMP) WITH MICROSCOPIC
BACTERIA UA: NONE SEEN
BILIRUBIN URINE: NEGATIVE
Glucose, UA: NEGATIVE mg/dL
HGB URINE DIPSTICK: NEGATIVE
Ketones, ur: NEGATIVE mg/dL
LEUKOCYTES UA: NEGATIVE
Nitrite: NEGATIVE
PROTEIN: NEGATIVE mg/dL
Specific Gravity, Urine: 1.013 (ref 1.005–1.030)
WBC UA: NONE SEEN WBC/hpf (ref 0–5)
pH: 7 (ref 5.0–8.0)

## 2016-11-09 LAB — CHLAMYDIA/NGC RT PCR (ARMC ONLY)
Chlamydia Tr: NOT DETECTED
N gonorrhoeae: NOT DETECTED

## 2016-11-09 MED ORDER — TAMSULOSIN HCL 0.4 MG PO CAPS: 0.4000 mg | ORAL_CAPSULE | Freq: Every day | ORAL | 0 refills | Status: DC

## 2016-11-09 NOTE — ED Notes (Signed)
Called lab about adding on chlamydia testing

## 2016-11-09 NOTE — ED Notes (Signed)
Patient reports halting urination today and lower back pain radiating into his groin for several days.  Patient is in no obvious distress at this time.

## 2016-11-09 NOTE — ED Triage Notes (Signed)
Pt to ed with c/o back pain and urinary frequency, burning, and pain.

## 2016-11-09 NOTE — ED Notes (Signed)
Administered bladder scan. 1st attempt , 2nd - .  Patient emptied bladder stated urine came out in dribbles at first then a very slow stream. Scanned emptied bladder. 1st 6 ml, 2nd- 97ml. AS

## 2016-11-10 NOTE — ED Provider Notes (Signed)
[facility] Emergency Department Provider Note  ____________________________________________  Time seen: Approximately 12:17 AM  I have reviewed the triage vital signs and the nursing notes.   HISTORY  Chief Complaint Back Pain    HPI Benjamin Crane. is a 31 y.o. male that presents to the emergency department with low back pain and difficulty peeing for 2 days. Patient states the pain begins on both sides of back and radiates around both sides. Patient states that he feels like he has difficulty initiating urination and it doesn't completely empty his bladder. He does not feel like he has to go. Patient has drank 3 bottles of water today. This has never happened before. No history of a hernia or kidney stone. Patient is concerned for his prostate. Patient is married and is not concerned for an STD. He denies fever, night sweats, SOB, CP, nausea, vomiting, abdominal pain, dysuria, penile discharge, testicular pain, bowel or bladder incontinence, saddle paresthesias, rash, weight loss.    Past Medical History:  Diagnosis Date  . Cocaine abuse     Patient Active Problem List   Diagnosis Date Noted  . 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 Cocaine abuse 09/23/2016    Past Surgical History:  Procedure Laterality Date  . TONSILLECTOMY      Prior to Admission medications   Medication Sig Start Date End Date Taking? Authorizing Provider  busPIRone (BUSPAR) 15 MG tablet Take 15 mg by mouth 2 (two) times daily.    Historical Provider, MD  escitalopram (LEXAPRO) 20 MG tablet Take 20 mg by mouth daily.    Historical Provider, MD  pantoprazole (PROTONIX) 40 MG tablet Take 40 mg by mouth daily.    Historical Provider, MD  tamsulosin (FLOMAX) 0.4 MG CAPS capsule Take 1 capsule (0.4 mg total) by mouth daily. 11/09/16   Enid Derry, PA-C    Allergies Patient has no known  allergies.  History reviewed. No pertinent family history.  Social History Social History  Substance Use Topics  . Smoking status: Current Every Day Smoker  . Smokeless tobacco: Never Used  . Alcohol use No     Review of Systems  Constitutional: No fever/chills Cardiovascular: No chest pain. Respiratory: No SOB. Gastrointestinal: No abdominal pain.  No nausea, no vomiting.  Musculoskeletal: Negative for musculoskeletal pain. Skin: Negative for rash, abrasions, lacerations, ecchymosis. Neurological: Negative for headaches, numbness or tingling   ____________________________________________   PHYSICAL EXAM:  VITAL SIGNS: ED Triage Vitals  Enc Vitals Group     BP 11/09/16 1810 120/73     Pulse Rate 11/09/16 1810 89     Resp 11/09/16 1810 18     Temp 11/09/16 1810 98.2 F (36.8 C)     Temp Source 11/09/16 1810 Oral     SpO2 11/09/16 1810 99 %     Weight 11/09/16 1811 160 lb (72.6 kg)     Height --      Head Circumference --      Peak Flow --      Pain Score 11/09/16 1810 0     Pain Loc --      Pain Edu? --      Excl. in GC? --      Constitutional: Alert and oriented. Well appearing and in no acute distress. Eyes: Conjunctivae are normal. PERRL. EOMI. Head: Atraumatic. ENT:      Ears:      Nose: No congestion/rhinnorhea.  Mouth/Throat: Mucous membranes are moist.  Neck: No stridor.   Cardiovascular: Normal rate, regular rhythm.  Good peripheral circulation. Respiratory: Normal respiratory effort without tachypnea or retractions. Lungs CTAB. Good air entry to the bases with no decreased or absent breath sounds. Gastrointestinal: Bowel sounds 4 quadrants. Soft and nontender to palpation. No guarding or rigidity. No palpable masses. No distention. No CVA tenderness. Musculoskeletal: Full range of motion to all extremities. No gross deformities appreciated. No tenderness to palpation over thoracic or lumbar spine. Neurologic:  Normal speech and language. No  gross focal neurologic deficits are appreciated.  Skin:  Skin is warm, dry and intact. No rash noted.   ____________________________________________   LABS (all labs ordered are listed, but only abnormal results are displayed)  Labs Reviewed  URINALYSIS, COMPLETE (UACMP) WITH MICROSCOPIC - Abnormal; Notable for the following:       Result Value   Color, Urine YELLOW (*)    APPearance CLEAR (*)    Squamous Epithelial / LPF 0-5 (*)    All other components within normal limits  CHLAMYDIA/NGC RT PCR ([facility] ONLY)   ____________________________________________  EKG   ____________________________________________  RADIOLOGY Lexine Baton, personally viewed and evaluated these images (plain radiographs) as part of my medical decision making, as well as reviewing the written report by the radiologist.  Ct Renal Stone Study  Result Date: 11/09/2016 CLINICAL DATA:  Flank and low back pain radiating to the groin for several days. EXAM: CT ABDOMEN AND PELVIS WITHOUT CONTRAST TECHNIQUE: Multidetector CT imaging of the abdomen and pelvis was performed following the standard protocol without IV contrast. COMPARISON:  12/29/2010 CT abdomen/pelvis. FINDINGS: Lower chest: No significant pulmonary nodules or acute consolidative airspace disease. Hepatobiliary: Normal liver with no liver mass. Normal gallbladder with no radiopaque cholelithiasis. No biliary ductal dilatation. Pancreas: Normal, with no mass or duct dilation. Spleen: Normal size. No mass. Adrenals/Urinary Tract: Normal adrenals. No renal stones. No hydronephrosis. No contour deforming renal mass. Normal caliber ureters, with no ureteral stones. Normal bladder, with no gallbladder wall thickening or bladder stones . Stomach/Bowel: Grossly normal stomach. Normal caliber small bowel with no small bowel wall thickening. Normal appendix. Normal large bowel with no diverticulosis, large bowel wall thickening or pericolonic fat stranding.  Vascular/Lymphatic: Normal caliber abdominal aorta. No pathologically enlarged lymph nodes in the abdomen or pelvis. Reproductive: Normal size prostate. Other: No pneumoperitoneum, ascites or focal fluid collection. Musculoskeletal: No aggressive appearing focal osseous lesions. Small sclerotic lesion in the medial right acetabulum is stable since 12/29/2010, compatible with a benign bone island. IMPRESSION: No acute abnormality. No urolithiasis. No evidence of urinary tract obstruction. No evidence of bowel obstruction or acute bowel inflammation. Normal appendix. Electronically Signed   By: Delbert Phenix M.D.   On: 11/09/2016 20:52    ____________________________________________    PROCEDURES  Procedure(s) performed:    Procedures    Medications - No data to display   ____________________________________________   INITIAL IMPRESSION / ASSESSMENT AND PLAN / ED COURSE  Pertinent labs & imaging results that were available during my care of the patient were reviewed by me and considered in my medical decision making (see chart for details).  Review of the [place] CSRS was performed in accordance of the [place] prior to dispensing any controlled drugs.   He presented to the emergency department with difficulty urinating for 2 days. Vital signs and exam are reassuring. No abdominal pain, penile pain, testicular pain. Back is nontender to palpation. No systemic symptoms. No indication of infection  on urinalysis. No kidney stone on CT. Gonorrhea and chlamydia are negative. When giving urine sample patient states he was not having any difficulty urinating at that time. Post void bladder scan shows of urine. Patient deferred rectal exam and states that he will to call his PCP tomorrow to have rectal exam completed and PSA drawn. Patient will be discharged home with prescriptions for a short course of Flomax. Patient will be referred to urology. Patient is given ED precautions to return to the ED  for any worsening or new symptoms.     ____________________________________________  FINAL CLINICAL IMPRESSION(S) / ED DIAGNOSES  Final diagnoses:  Acute bilateral low back pain without sciatica  Urinary disorder      NEW MEDICATIONS STARTED DURING THIS VISIT:  Discharge Medication List as of 11/09/2016  9:50 PM    START taking these medications   Details  tamsulosin (FLOMAX) 0.4 MG CAPS capsule Take 1 capsule (0.4 mg total) by mouth daily., Starting Tue 11/09/2016, Print            This chart was dictated using voice recognition software/Dragon. Despite best efforts to proofread, errors can occur which can change the meaning. Any change was purely unintentional.    Enid Derry, PA-C 11/10/16 1316    Phineas Semen, MD 11/10/16 1538

## 2018-03-27 ENCOUNTER — Encounter: Payer: Self-pay | Admitting: Emergency Medicine

## 2018-03-27 ENCOUNTER — Other Ambulatory Visit: Payer: Self-pay

## 2018-03-27 ENCOUNTER — Emergency Department
Admission: EM | Admit: 2018-03-27 | Discharge: 2018-03-27 | Disposition: A | Discharge status: Home / Self Care | Payer: Self-pay | Attending: Student in an Organized Health Care Education/Training Program | Admitting: Student in an Organized Health Care Education/Training Program

## 2018-03-27 DIAGNOSIS — Z23 Encounter for immunization: Secondary | ICD-10-CM | POA: Insufficient documentation

## 2018-03-27 DIAGNOSIS — Z79899 Other long term (current) drug therapy: Secondary | ICD-10-CM | POA: Insufficient documentation

## 2018-03-27 DIAGNOSIS — Y92812 Truck as the place of occurrence of the external cause: Secondary | ICD-10-CM | POA: Insufficient documentation

## 2018-03-27 DIAGNOSIS — F17200 Nicotine dependence, unspecified, uncomplicated: Secondary | ICD-10-CM | POA: Insufficient documentation

## 2018-03-27 DIAGNOSIS — L089 Local infection of the skin and subcutaneous tissue, unspecified: Secondary | ICD-10-CM | POA: Insufficient documentation

## 2018-03-27 DIAGNOSIS — Y998 Other external cause status: Secondary | ICD-10-CM | POA: Insufficient documentation

## 2018-03-27 DIAGNOSIS — L03011 Cellulitis of right finger: Secondary | ICD-10-CM | POA: Insufficient documentation

## 2018-03-27 DIAGNOSIS — Y9389 Activity, other specified: Secondary | ICD-10-CM | POA: Insufficient documentation

## 2018-03-27 DIAGNOSIS — S60460A Insect bite (nonvenomous) of right index finger, initial encounter: Secondary | ICD-10-CM | POA: Insufficient documentation

## 2018-03-27 DIAGNOSIS — W57XXXA Bitten or stung by nonvenomous insect and other nonvenomous arthropods, initial encounter: Secondary | ICD-10-CM | POA: Insufficient documentation

## 2018-03-27 MED ORDER — CLINDAMYCIN HCL 300 MG PO CAPS: 300.0000 mg | ORAL_CAPSULE | Freq: Four times a day (QID) | ORAL | 0 refills | Status: AC

## 2018-03-27 MED ORDER — CLINDAMYCIN PHOSPHATE 300 MG/2ML IJ SOLN
INTRAMUSCULAR | Status: AC
  Filled 2018-03-27: qty 4

## 2018-03-27 MED ORDER — TETANUS-DIPHTH-ACELL PERTUSSIS 5-2.5-18.5 LF-MCG/0.5 IM SUSP
0.5000 mL | Freq: Once | INTRAMUSCULAR | Status: AC
  Administered 2018-03-27: 0.5 mL via INTRAMUSCULAR
  Filled 2018-03-27: qty 0.5

## 2018-03-27 MED ORDER — CLINDAMYCIN PHOSPHATE 600 MG/4ML IJ SOLN
600.0000 mg | Freq: Once | INTRAMUSCULAR | Status: AC
  Administered 2018-03-27: 600 mg via INTRAMUSCULAR
  Filled 2018-03-27 (×2): qty 4

## 2018-03-27 NOTE — ED Notes (Signed)
See triage note. Pt presents to ED s/p bite to base of right pointer finger - area reddened with center pustule. Denies fevers. ABCs intact. NAD

## 2018-03-27 NOTE — ED Provider Notes (Signed)
[facility] Emergency Department Provider Note  ____________________________________________  Time seen: Approximately 5:31 PM  I have reviewed the triage vital signs and the nursing notes.   HISTORY  Chief Complaint Insect Bite    HPI Benjamin Crane. is a 32 y.o. male that presents emergency department for evaluation of infection to right finger after insect bite yesterday.  Patient reached into open his truck and was bitten by something.  Finger started swelling this morning.  No fever, chills.   Past Medical History:  Diagnosis Date  . Cocaine abuse [facility])     Patient Active Problem List   Diagnosis Date Noted  . 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 Cocaine abuse (HCC) 09/23/2016    Past Surgical History:  Procedure Laterality Date  . TONSILLECTOMY      Prior to Admission medications   Medication Sig Start Date End Date Taking? Authorizing Provider  busPIRone (BUSPAR) 15 MG tablet Take 15 mg by mouth 2 (two) times daily.    [provider]  clindamycin (CLEOCIN) 300 MG capsule Take 1 capsule (300 mg total) by mouth 4 (four) times daily for 10 days. 03/27/18 04/06/18  Enid Derry, PA-C  escitalopram (LEXAPRO) 20 MG tablet Take 20 mg by mouth daily.    [provider]  pantoprazole (PROTONIX) 40 MG tablet Take 40 mg by mouth daily.    [provider]  tamsulosin (FLOMAX) 0.4 MG CAPS capsule Take 1 capsule (0.4 mg total) by mouth daily. 11/09/16   Enid Derry, PA-C    Allergies Patient has no known allergies.  No family history on file.  Social History Social History   Tobacco Use  . Smoking status: Current Every Day Smoker  . Smokeless tobacco: Never Used  Substance Use Topics  . Alcohol use: No  . Drug use: No     Review of Systems  Constitutional: No fever/chills Respiratory: No SOB. Gastrointestinal: No  nausea, no vomiting.  Musculoskeletal: Positive for finger pain. Skin: Negative for abrasions, lacerations, ecchymosis.  Positive for rash.   ____________________________________________   PHYSICAL EXAM:  VITAL SIGNS: ED Triage Vitals  Enc Vitals Group     BP 03/27/18 1632 (!) 148/98     Pulse Rate 03/27/18 1632 (!) 110     Resp 03/27/18 1632 (!) 8     Temp 03/27/18 1632 98.2 F (36.8 C)     Temp Source 03/27/18 1632 Oral     SpO2 03/27/18 1632 98 %     Weight 03/27/18 1634 150 lb (68 kg)     Height 03/27/18 1634 5\' 10"  (1.778 m)     Head Circumference --      Peak Flow --      Pain Score 03/27/18 1634 6     Pain Loc --      Pain Edu? --      Excl. in GC? --      Constitutional: Alert and oriented. Well appearing and in no acute distress. Eyes: Conjunctivae are normal. PERRL. EOMI. Head: Atraumatic. ENT:      Ears:      Nose: No congestion/rhinnorhea.      Mouth/Throat: Mucous membranes are moist.  Neck: No stridor.   Cardiovascular: Normal rate, regular rhythm.  Good peripheral circulation. Respiratory: Normal respiratory effort without tachypnea or retractions. Lungs CTAB. Good air entry to the bases with no decreased or absent breath sounds. Musculoskeletal: Full range of motion to all  extremities. No gross deformities appreciated.  Full range of motion of finger. Neurologic:  Normal speech and language. No gross focal neurologic deficits are appreciated.  Skin:  Skin is warm, dry.  Pustule to right dorsal proximal index finger with 1 inch of surrounding erythema.Marland Kitchen Psychiatric: Mood and affect are normal. Speech and behavior are normal. Patient exhibits appropriate insight and judgement.   ____________________________________________   LABS (all labs ordered are listed, but only abnormal results are displayed)  Labs Reviewed - No data to  display ____________________________________________  EKG   ____________________________________________  RADIOLOGY  No results found.  ____________________________________________    PROCEDURES  Procedure(s) performed:    Procedures  INCISION AND DRAINAGE Performed by: Enid Derry Consent: Verbal consent obtained. Risks and benefits: risks, benefits and alternatives were discussed Type: pustule  Body area: finger  Anesthesia: none  Incision was made with an 18G needle  Anesthetic total: none  Drainage: purulent  Drainage amount: 1/4 cc  Patient tolerance: Patient tolerated the procedure well with no immediate complications.    Medications  Tdap (BOOSTRIX) injection 0.5 mL (0.5 mLs Intramuscular Given 03/27/18 1750)  clindamycin (CLEOCIN) injection 600 mg (600 mg Intramuscular Given 03/27/18 1750)     ____________________________________________   INITIAL IMPRESSION / ASSESSMENT AND PLAN / ED COURSE  Pertinent labs & imaging results that were available during my care of the patient were reviewed by me and considered in my medical decision making (see chart for details).  Review of the [place] CSRS was performed in accordance of the NCM[facility] prior to dispensing any controlled drugs.  Patient's diagnosis is consistent with pustule and cellulitis following insect bite.  Tetanus shot was updated.  IM clindamycin was given.  Patient will be discharged home with prescriptions for clindamycin. Patient is to follow up with primary care as directed. Patient is given ED precautions to return to the ED for any worsening or new symptoms.  Patient is agreeable to return to the emergency department tomorrow if area of cellulitis worsens.     ____________________________________________  FINAL CLINICAL IMPRESSION(S) / ED DIAGNOSES  Final diagnoses:  Insect bite of right index finger, initial encounter  Pustule  Cellulitis of finger of right hand      NEW MEDICATIONS  STARTED DURING THIS VISIT:  ED Discharge Orders         Ordered    clindamycin (CLEOCIN) 300 MG capsule  4 times daily     03/27/18 1758              This chart was dictated using voice recognition software/Dragon. Despite best efforts to proofread, errors can occur which can change the meaning. Any change was purely unintentional.    Enid Derry, PA-C 03/27/18 2035    Willy Eddy, MD 03/27/18 [phone]

## 2018-03-27 NOTE — ED Triage Notes (Signed)
States was bitten by something yesterday. R index reddened with center pustule.

## 2018-05-05 ENCOUNTER — Encounter: Payer: Self-pay | Admitting: Emergency Medicine

## 2018-05-05 ENCOUNTER — Emergency Department
Admission: EM | Admit: 2018-05-05 | Discharge: 2018-05-06 | Disposition: A | Discharge status: Home / Self Care | Payer: Medicaid Other | Attending: Emergency Medicine | Admitting: Emergency Medicine

## 2018-05-05 ENCOUNTER — Other Ambulatory Visit: Payer: Self-pay

## 2018-05-05 DIAGNOSIS — F17200 Nicotine dependence, unspecified, uncomplicated: Secondary | ICD-10-CM | POA: Insufficient documentation

## 2018-05-05 DIAGNOSIS — Z79899 Other long term (current) drug therapy: Secondary | ICD-10-CM | POA: Insufficient documentation

## 2018-05-05 DIAGNOSIS — F22 Delusional disorders: Secondary | ICD-10-CM | POA: Insufficient documentation

## 2018-05-05 LAB — CBC
HEMATOCRIT: 47.4 % (ref 39.0–52.0)
Hemoglobin: 16.2 g/dL (ref 13.0–17.0)
MCH: 28.9 pg (ref 26.0–34.0)
MCHC: 34.2 g/dL (ref 30.0–36.0)
MCV: 84.6 fL (ref 80.0–100.0)
PLATELETS: 322 10*3/uL (ref 150–400)
RBC: 5.6 MIL/uL (ref 4.22–5.81)
RDW: 12.8 % (ref 11.5–15.5)
WBC: 12.3 10*3/uL — AB (ref 4.0–10.5)
nRBC: 0 % (ref 0.0–0.2)

## 2018-05-05 LAB — COMPREHENSIVE METABOLIC PANEL
ALK PHOS: 88 U/L (ref 38–126)
ALT: 21 U/L (ref 0–44)
AST: 28 U/L (ref 15–41)
Albumin: 5 g/dL (ref 3.5–5.0)
Anion gap: 10 (ref 5–15)
BILIRUBIN TOTAL: 0.9 mg/dL (ref 0.3–1.2)
BUN: 10 mg/dL (ref 6–20)
CALCIUM: 10.4 mg/dL — AB (ref 8.9–10.3)
CO2: 22 mmol/L (ref 22–32)
CREATININE: 1.36 mg/dL — AB (ref 0.61–1.24)
Chloride: 109 mmol/L (ref 98–111)
Glucose, Bld: 116 mg/dL — ABNORMAL HIGH (ref 70–99)
Potassium: 3.7 mmol/L (ref 3.5–5.1)
Sodium: 141 mmol/L (ref 135–145)
TOTAL PROTEIN: 8 g/dL (ref 6.5–8.1)

## 2018-05-05 LAB — ETHANOL

## 2018-05-05 MED ORDER — LORAZEPAM 2 MG/ML IJ SOLN
2.0000 mg | Freq: Once | INTRAMUSCULAR | Status: AC
  Administered 2018-05-05: 2 mg via INTRAMUSCULAR

## 2018-05-05 MED ORDER — LORAZEPAM 2 MG/ML IJ SOLN
INTRAMUSCULAR | Status: AC
  Administered 2018-05-05: 2 mg via INTRAMUSCULAR
  Filled 2018-05-05: qty 1

## 2018-05-05 MED ORDER — HALOPERIDOL LACTATE 5 MG/ML IJ SOLN
INTRAMUSCULAR | Status: AC
  Administered 2018-05-05: 5 mg via INTRAMUSCULAR
  Filled 2018-05-05: qty 1

## 2018-05-05 MED ORDER — HALOPERIDOL LACTATE 5 MG/ML IJ SOLN
5.0000 mg | Freq: Once | INTRAMUSCULAR | Status: AC
  Administered 2018-05-05: 5 mg via INTRAMUSCULAR

## 2018-05-05 NOTE — ED Notes (Signed)
Forensic restraints removed from wrists and ankles.  Pt. Resting comfortably.

## 2018-05-05 NOTE — ED Notes (Signed)
Pt waiting in family wait room with [employer] officers at this time

## 2018-05-05 NOTE — ED Provider Notes (Signed)
[facility] Emergency Department Provider Note ____________________________________________   First MD Initiated Contact with Patient 05/05/18 1951     (approximate)  I have reviewed the triage vital signs and the nursing notes.   HISTORY  Chief Complaint No chief complaint on file.  Level 5 caveat: History of present illness limited due to uncooperative behavior and altered mental status  HPI Shavon Zenz. is a 32 y.o. male with history of substance abuse who presents under IVC for hallucinations.  The patient denies drug use to me today.  He is unable to provide much other history.  Past Medical History:  Diagnosis Date  . Cocaine abuse [facility])     Patient Active Problem List   Diagnosis Date Noted  . 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 Cocaine abuse (HCC) 09/23/2016    Past Surgical History:  Procedure Laterality Date  . TONSILLECTOMY      Prior to Admission medications   Medication Sig Start Date End Date Taking? Authorizing Provider  busPIRone (BUSPAR) 15 MG tablet Take 15 mg by mouth 2 (two) times daily.    [provider]  escitalopram (LEXAPRO) 20 MG tablet Take 20 mg by mouth daily.    [provider]  pantoprazole (PROTONIX) 40 MG tablet Take 40 mg by mouth daily.    [provider]  tamsulosin (FLOMAX) 0.4 MG CAPS capsule Take 1 capsule (0.4 mg total) by mouth daily. 11/09/16   Enid Derry, PA-C    Allergies Patient has no known allergies.  No family history on file.  Social History Social History   Tobacco Use  . Smoking status: Current Every Day Smoker  . Smokeless tobacco: Never Used  Substance Use Topics  . Alcohol use: No  . Drug use: No    Review of Systems Level 5 caveat: Unable to obtain review of systems due to uncooperative behavior and altered mental  status    ____________________________________________   PHYSICAL EXAM:  VITAL SIGNS: ED Triage Vitals  Enc Vitals Group     BP --      Pulse Rate 05/05/18 1842 (!) 110     Resp 05/05/18 1842 18     Temp 05/05/18 1842 98 F (36.7 C)     Temp Source 05/05/18 1842 Oral     SpO2 05/05/18 1842 98 %     Weight --      Height --      Head Circumference --      Peak Flow --      Pain Score 05/05/18 1841 0     Pain Loc --      Pain Edu? --      Excl. in GC? --     Constitutional: Alert, anxious appearing.  Agitated. Eyes: Conjunctivae are normal.  EOMI. Head: Atraumatic. Nose: No congestion/rhinnorhea. Mouth/Throat: Mucous membranes are moist.   Neck: Normal range of motion.  Cardiovascular: Good peripheral circulation. Respiratory: Normal respiratory effort.  Gastrointestinal:  No distention.  Musculoskeletal:  Extremities warm and well perfused.  Neurologic: Motor intact in all extremities. Skin:  Skin is warm and dry. No rash noted. Psychiatric: Agitated, combative, and anxious appearing.  ____________________________________________   LABS (all labs ordered are listed, but only abnormal results are displayed)  Labs Reviewed  COMPREHENSIVE METABOLIC PANEL - Abnormal; Notable for the following components:      Result Value   Glucose, Bld 116 (*)    Creatinine,  Ser 1.36 (*)    Calcium 10.4 (*)    All other components within normal limits  CBC - Abnormal; Notable for the following components:   WBC 12.3 (*)    All other components within normal limits  ETHANOL  URINE DRUG SCREEN, QUALITATIVE ([facility] ONLY)   ____________________________________________  EKG   ____________________________________________  RADIOLOGY    ____________________________________________   PROCEDURES  Procedure(s) performed: No  Procedures  Critical Care performed: No ____________________________________________   INITIAL IMPRESSION / ASSESSMENT AND PLAN / ED  COURSE  Pertinent labs & imaging results that were available during my care of the patient were reviewed by me and considered in my medical decision making (see chart for details).  32 year old male with history of polysubstance abuse and psychosis presents under involuntary commitment with paranoid delusions and erratic behavior.  In the ED, the patient is acutely agitated and combative.  He attempted to run out of the ED.  The patient appears acutely paranoid and is unable to carry on a coherent conversation.  He demonstrates acute danger to self and others, and lacks decision-making capacity to refuse care at this time.  Due to his acute agitation and attempt to run out of the ED, he required sedation.  We will obtain labs for medical clearance, [facility] consultation when the patient is awake, and disposition per psychiatry recommendations.  ----------------------------------------- 11:46 PM on 05/05/2018 -----------------------------------------  Lab work-up is unremarkable.  The patient is medically cleared.  When he is alert and able to cooperate with evaluation, he will be seen by [facility].  I am signing the patient out to the oncoming physician Dr. Dolores Frame. ____________________________________________   FINAL CLINICAL IMPRESSION(S) / ED DIAGNOSES  Final diagnoses:  Delusions (HCC)      NEW MEDICATIONS STARTED DURING THIS VISIT:  New Prescriptions   No medications on file     Note:  This document was prepared using Dragon voice recognition software and may include unintentional dictation errors.    Dionne Bucy, MD 05/05/18 [phone]

## 2018-05-05 NOTE — ED Notes (Signed)
Pt dressing out, belongings into 1 bag and 1 backpack that pt arrived with

## 2018-05-05 NOTE — ED Notes (Signed)
Pt. Brought back to room from family room.  Pt. Paced around room and tried to run past security, security was able to capture patient in quad.  Security had to retrain patient in forensic restraints to wrists and ankles.  MD notified, orders given.

## 2018-05-05 NOTE — ED Notes (Signed)
TTS has attempted to completed a behavioral health assessment. TTS has spoken with pts nurse who report that pt was given an IM medication and is extremely somnolent. Pt was unable to offer information due to altered mental status. Upon presentation pt admits to drug and alcohol use PTA. TTS assessment will be completed when pt is appropria

## 2018-05-05 NOTE — ED Notes (Signed)
Pt dressing out at hti s time

## 2018-05-05 NOTE — ED Notes (Signed)
Spoke with patient, while patient was in restraints.  Pt. Asked "Why did you try to run a way?".  Pt. Did not have answer.  Pt. Asked "have you been drinking alcohol today?"  Pt. Answered "yes".  Pt. Asked, "Have you taken any street drugs today?"  Pt. Answered "yes" to heroin use.  Pt. Asked "Do you want help to get off ETOH and heroin?"  Pt. Indicated "Yes".  Pt. Told "We are here to help, please ask a question or raise any concerns with this nurse

## 2018-05-05 NOTE — ED Triage Notes (Addendum)
PT to ED via [employer] for IVC. Per PD responded to pt residence when pt called out for hallucinations. Per PD when arrived pt began to run away. PT was then detained and IVC for safety. Pt extremely  Fidgety  in triage, calm and cooperartive but paranoid. PT does not respond to [facility] or drug use questioning.

## 2018-05-06 NOTE — BH Assessment (Signed)
TTS made another attempt to complete consult/assessment but patient is too lethargic to participate in the assessment.

## 2018-05-06 NOTE — ED Notes (Signed)
Nurse talked with patient and he states that he would like to see a Child psychotherapist regarding getting back on medicaid, nurse let him know that she would attempt to contact SW. Patient is cooperative and no signs of distress.

## 2018-05-06 NOTE — ED Notes (Signed)
TTS (calvin) talked to Patient and received limited feedback due to patient being sedated/sleepy, no signs of behaviors or distress.

## 2018-05-06 NOTE — ED Provider Notes (Signed)
Cleared for discharge by tele-psychiatry   Jene Every, MD 05/06/18 [phone]

## 2018-05-06 NOTE — ED Notes (Signed)
Patient received lunch tray and beverage. 

## 2018-05-06 NOTE — ED Notes (Signed)

## 2018-05-06 NOTE — ED Notes (Signed)
Nurse went in to check on patient, called his name and he just rolled over, Patient remains sleepy , will continue to monitor. q 15 minute checks and camera surveillance in progress for safety.

## 2018-05-06 NOTE — ED Notes (Signed)
Dr. Jeanene Erb to get report , Dr.Vainranath.

## 2018-05-06 NOTE — ED Notes (Signed)
Supper tray and beverage given to Patient. Patient woke up but went back to sleep.

## 2018-05-06 NOTE — ED Notes (Signed)
Patient received PM snack. 

## 2018-05-06 NOTE — BH Assessment (Signed)
TTS attempted to complete Consult but patient continues to be too lethargic to participate in the assessment.

## 2018-05-06 NOTE — ED Notes (Addendum)
Dr. Jeanene Erb back and states that He is going to release the Patient from the hospital.

## 2018-05-06 NOTE — ED Notes (Addendum)
Patient transferred from quad to room 3, received report from Nicole Cella RN , patient is calm and cooperative, states that He just wants to sleep for now, complained of wrist pain, and nurse ask to see if wrist was swollen and He said " look at it later" , patient is safe, no behavioral issues, no si/hi or avh , will continue to monitor, q 15 minute checks and camera surveillance in progress for safety.

## 2018-05-06 NOTE — ED Provider Notes (Signed)
-----------------------------------------   7:12 AM on 05/06/2018 -----------------------------------------   Pulse (!) 110, temperature 98 F (36.7 C), temperature source Oral, resp. rate 18, SpO2 98 %.  The patient had no acute events since last update.  Calm and cooperative at this time.  Disposition is pending Psychiatry/Behavioral Medicine team recommendations.     Irean Hong, MD 05/06/18 [phone]

## 2018-05-06 NOTE — ED Notes (Signed)
S.O.C. In progress at this time, Patient awake and cooperative.

## 2018-05-07 ENCOUNTER — Emergency Department
Admission: EM | Admit: 2018-05-07 | Discharge: 2018-05-07 | Disposition: A | Discharge status: Home / Self Care | Payer: Medicaid Other | Attending: Emergency Medicine | Admitting: Emergency Medicine

## 2018-05-07 ENCOUNTER — Encounter: Payer: Self-pay | Admitting: Emergency Medicine

## 2018-05-07 ENCOUNTER — Other Ambulatory Visit: Payer: Self-pay

## 2018-05-07 ENCOUNTER — Emergency Department: Payer: Medicaid Other

## 2018-05-07 DIAGNOSIS — Z79899 Other long term (current) drug therapy: Secondary | ICD-10-CM | POA: Insufficient documentation

## 2018-05-07 DIAGNOSIS — F17210 Nicotine dependence, cigarettes, uncomplicated: Secondary | ICD-10-CM | POA: Insufficient documentation

## 2018-05-07 DIAGNOSIS — Y33XXXA Other specified events, undetermined intent, initial encounter: Secondary | ICD-10-CM | POA: Insufficient documentation

## 2018-05-07 DIAGNOSIS — Y998 Other external cause status: Secondary | ICD-10-CM | POA: Insufficient documentation

## 2018-05-07 DIAGNOSIS — Y939 Activity, unspecified: Secondary | ICD-10-CM | POA: Insufficient documentation

## 2018-05-07 DIAGNOSIS — S63502A Unspecified sprain of left wrist, initial encounter: Secondary | ICD-10-CM | POA: Insufficient documentation

## 2018-05-07 DIAGNOSIS — Y929 Unspecified place or not applicable: Secondary | ICD-10-CM | POA: Insufficient documentation

## 2018-05-07 HISTORY — DX: Gastro-esophageal reflux disease without esophagitis: K21.9

## 2018-05-07 HISTORY — DX: Attention-deficit hyperactivity disorder, unspecified type: F90.9

## 2018-05-07 HISTORY — DX: Depression, unspecified: F32.A

## 2018-05-07 HISTORY — DX: Anxiety disorder, unspecified: F41.9

## 2018-05-07 HISTORY — DX: Major depressive disorder, single episode, unspecified: F32.9

## 2018-05-07 NOTE — ED Provider Notes (Signed)
[facility] Emergency Department Provider Note  ____________________________________________   First MD Initiated Contact with Patient 05/07/18 1423     (approximate)  I have reviewed the triage vital signs and the nursing notes.   HISTORY  Chief Complaint Wrist Pain    HPI Jermale Crass. is a 32 y.o. male presents emergency department complaining of left wrist pain.  States that he was here in the hospital tried to leave and resisted arrest.  He said the security officer pulled on the wrist and put his arm in a lock.  He heard a pop in his wrist.  He states it is painful to move.  He denies any other injuries.    Past Medical History:  Diagnosis Date  . ADHD   . Anxiety   . Cocaine abuse (HCC)   . Depression   . GERD (gastroesophageal reflux disease)     Patient Active Problem List   Diagnosis Date Noted  . 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 Cocaine abuse (HCC) 09/23/2016    Past Surgical History:  Procedure Laterality Date  . TONSILLECTOMY      Prior to Admission medications   Medication Sig Start Date End Date Taking? Authorizing Provider  busPIRone (BUSPAR) 15 MG tablet Take 15 mg by mouth 2 (two) times daily.    [provider]  escitalopram (LEXAPRO) 20 MG tablet Take 20 mg by mouth daily.    [provider]  pantoprazole (PROTONIX) 40 MG tablet Take 40 mg by mouth daily.    [provider]    Allergies Patient has no known allergies.  History reviewed. No pertinent family history.  Social History Social History   Tobacco Use  . Smoking status: Current Every Day Smoker    Types: E-cigarettes  . Smokeless tobacco: Never Used  Substance Use Topics  . Alcohol use: No  . Drug use: Not Currently    Review of Systems  Constitutional: No fever/chills Eyes: No visual changes. ENT: No sore  throat. Respiratory: Denies cough Genitourinary: Negative for dysuria. Musculoskeletal: Negative for back pain.  Positive for left wrist pain Skin: Negative for rash.    ____________________________________________   PHYSICAL EXAM:  VITAL SIGNS: ED Triage Vitals [05/07/18 1321]  Enc Vitals Group     BP 129/77     Pulse Rate 96     Resp 14     Temp (!) 97.4 F (36.3 C)     Temp Source Oral     SpO2 96 %     Weight 150 lb (68 kg)     Height 5\' 10"  (1.778 m)     Head Circumference      Peak Flow      Pain Score 7     Pain Loc      Pain Edu?      Excl. in GC?     Constitutional: Alert and oriented. Well appearing and in no acute distress. Eyes: Conjunctivae are normal.  Head: Atraumatic. Nose: No congestion/rhinnorhea. Mouth/Throat: Mucous membranes are moist.   Neck:  supple no lymphadenopathy noted Cardiovascular: Normal rate, regular rhythm. Respiratory: Normal respiratory effort.  No retractions GU: deferred Musculoskeletal: FROM all extremities, warm and well perfused.  Left wrist is tender to palpation.  Neurovascular is intact Neurologic:  Normal speech and language.  Skin:  Skin is warm, dry and intact. No rash noted. Psychiatric: Mood and affect are normal. Speech and behavior  are normal.  ____________________________________________   LABS (all labs ordered are listed, but only abnormal results are displayed)  Labs Reviewed - No data to display ____________________________________________   ____________________________________________  RADIOLOGY  X-ray left wrist is negative  ____________________________________________   PROCEDURES  Procedure(s) performed: Velcro cock-up splint applied  Procedures    ____________________________________________   INITIAL IMPRESSION / ASSESSMENT AND PLAN / ED COURSE  Pertinent labs & imaging results that were available during my care of the patient were reviewed by me and considered in my medical  decision making (see chart for details).   Patient is 32 year old male presents emergency department complaining of left wrist pain.  On physical exam left wrist is tender and swollen.  Full range of motion is intact.  Neurovascular is intact.  X-ray of the left wrist is negative for fracture.  The patient was given a cock-up splint and x-ray results were discussed with him.  He is to take Tylenol and ibuprofen for pain as needed.  Elevate and ice.  He states he understands will comply with our instructions.  He is to follow-up with his regular doctor or orthopedics if not better 5 7 days.  He was discharged in stable condition.     As part of my medical decision making, I reviewed the following data within the electronic MEDICAL RECORD NUMBER Nursing notes reviewed and incorporated, Old chart reviewed, Radiograph reviewed x-ray left wrist is negative, Notes from prior ED visits and [place] Controlled Substance Database  ____________________________________________   FINAL CLINICAL IMPRESSION(S) / ED DIAGNOSES  Final diagnoses:  Sprain of left wrist, initial encounter      NEW MEDICATIONS STARTED DURING THIS VISIT:  Current Discharge Medication List       Note:  This document was prepared using Dragon voice recognition software and may include unintentional dictation errors.    Faythe Ghee, PA-C 05/07/18 1524    Minna Antis, MD 05/07/18 [phone]

## 2018-05-07 NOTE — Discharge Instructions (Addendum)
Follow-up with your regular doctor if not better in 5 7 days.  Or you may follow-up with Dr. Ernest Pine who is orthopedic on call.  Apply ice to the wrist.  Take Tylenol and ibuprofen for pain as needed.  Return if worsening.

## 2018-05-07 NOTE — ED Triage Notes (Addendum)
Pt c/o left wrist pain after being placed in handcuffs yesterday. No obvious deformity.  NAD.  VSS.  Reports heard something in wrist pop.

## 2018-05-16 ENCOUNTER — Encounter: Payer: Self-pay | Admitting: Urology

## 2018-05-16 ENCOUNTER — Ambulatory Visit: Payer: Medicaid Other | Admitting: Urology

## 2018-05-16 NOTE — Progress Notes (Deleted)
05/16/2018 8:33 AM   Benjamin Crane. Feb 15, 1986 [id number]  Referring provider: Evelene Croon, MD [address] [place], [place] [address]  No chief complaint on file.   HPI: Patient is a 32 year old Caucasian male who was referred by Dr. Lacie Scotts for urge incontinence and symptoms of low testosterone.      PMH: Past Medical History:  Diagnosis Date  . ADHD   . Anxiety   . Cocaine abuse (HCC)   . Depression   . GERD (gastroesophageal reflux disease)     Surgical History: Past Surgical History:  Procedure Laterality Date  . TONSILLECTOMY      Home Medications:  Allergies as of 05/16/2018   No Known Allergies     Medication List        Accurate as of 05/16/18  8:33 AM. Always use your most recent med list.          busPIRone 15 MG tablet Commonly known as:  BUSPAR Take 15 mg by mouth 2 (two) times daily.   escitalopram 20 MG tablet Commonly known as:  LEXAPRO Take 20 mg by mouth daily.   pantoprazole 40 MG tablet Commonly known as:  PROTONIX Take 40 mg by mouth daily.       Allergies: No Known Allergies  Family History: No family history on file.  Social History:  reports that he has been smoking e-cigarettes. He has never used smokeless tobacco. He reports that he has current or past drug history. He reports that he does not drink alcohol.  ROS:                                        Physical Exam: There were no vitals taken for this visit.  Constitutional:  Well nourished. Alert and oriented, No acute distress. HEENT: [place] AT, moist mucus membranes.  Trachea midline, no masses. Cardiovascular: No clubbing, cyanosis, or edema. Respiratory: Normal respiratory effort, no increased work of breathing. GI: Abdomen is soft, non tender, non distended, no abdominal masses. Liver and spleen not palpable.  No hernias appreciated.  Stool sample for occult testing is not indicated.   GU: No CVA tenderness.  No bladder  fullness or masses.  Patient with circumcised/uncircumcised phallus. ***Foreskin easily retracted***  Urethral meatus is patent.  No penile discharge. No penile lesions or rashes. Scrotum without lesions, cysts, rashes and/or edema.  Testicles are located scrotally bilaterally. No masses are appreciated in the testicles. Left and right epididymis are normal. Rectal: Patient with  normal sphincter tone. Anus and perineum without scarring or rashes. No rectal masses are appreciated. Prostate is approximately *** grams, *** nodules are appreciated. Seminal vesicles are normal. Skin: No rashes, bruises or suspicious lesions. Lymph: No cervical or inguinal adenopathy. Neurologic: Grossly intact, no focal deficits, moving all 4 extremities. Psychiatric: Normal mood and affect.  Laboratory Data: Lab Results  Component Value Date   WBC 12.3 (H) 05/05/2018   HGB 16.2 05/05/2018   HCT 47.4 05/05/2018   MCV 84.6 05/05/2018   PLT 322 05/05/2018    Lab Results  Component Value Date   CREATININE 1.36 (H) 05/05/2018    No results found for: PSA  No results found for: TESTOSTERONE  No results found for: HGBA1C  No results found for: TSH  No results found for: CHOL, HDL, CHOLHDL, VLDL, LDLCALC  Lab Results  Component Value Date   AST 28 05/05/2018  Lab Results  Component Value Date   ALT 21 05/05/2018   No components found for: ALKALINEPHOPHATASE No components found for: BILIRUBINTOTAL  No results found for: ESTRADIOL  Urinalysis ***  I have reviewed the labs.   Pertinent Imaging: *** I have independently reviewed the films.    Assessment & Plan:  ***  1. Urine retention *** - Bladder Scan (Post Void Residual) in office - Urinalysis, Complete   No follow-ups on file.  These notes generated with voice recognition software. I apologize for typographical errors.  Michiel Cowboy, PA-C  [facility] [address]  [address] [place],  [place] [address] [phone]

## 2018-07-13 ENCOUNTER — Emergency Department
Admission: EM | Admit: 2018-07-13 | Discharge: 2018-07-13 | Disposition: A | Discharge status: Home / Self Care | Payer: Medicaid Other | Attending: Emergency Medicine | Admitting: Emergency Medicine

## 2018-07-13 ENCOUNTER — Emergency Department: Payer: Medicaid Other

## 2018-07-13 DIAGNOSIS — L0291 Cutaneous abscess, unspecified: Secondary | ICD-10-CM

## 2018-07-13 DIAGNOSIS — L0211 Cutaneous abscess of neck: Secondary | ICD-10-CM | POA: Insufficient documentation

## 2018-07-13 DIAGNOSIS — F17210 Nicotine dependence, cigarettes, uncomplicated: Secondary | ICD-10-CM | POA: Insufficient documentation

## 2018-07-13 DIAGNOSIS — Z79899 Other long term (current) drug therapy: Secondary | ICD-10-CM | POA: Insufficient documentation

## 2018-07-13 LAB — CBC WITH DIFFERENTIAL/PLATELET
Abs Immature Granulocytes: 0.03 10*3/uL (ref 0.00–0.07)
Basophils Absolute: 0 10*3/uL (ref 0.0–0.1)
Basophils Relative: 0 %
Eosinophils Absolute: 0.1 10*3/uL (ref 0.0–0.5)
Eosinophils Relative: 1 %
HCT: 45.6 % (ref 39.0–52.0)
Hemoglobin: 15.5 g/dL (ref 13.0–17.0)
Immature Granulocytes: 0 %
Lymphocytes Relative: 18 %
Lymphs Abs: 2.3 10*3/uL (ref 0.7–4.0)
MCH: 28.7 pg (ref 26.0–34.0)
MCHC: 34 g/dL (ref 30.0–36.0)
MCV: 84.4 fL (ref 80.0–100.0)
Monocytes Absolute: 1 10*3/uL (ref 0.1–1.0)
Monocytes Relative: 7 %
Neutro Abs: 9.5 10*3/uL — ABNORMAL HIGH (ref 1.7–7.7)
Neutrophils Relative %: 74 %
Platelets: 225 10*3/uL (ref 150–400)
RBC: 5.4 MIL/uL (ref 4.22–5.81)
RDW: 13.1 % (ref 11.5–15.5)
WBC: 13 10*3/uL — ABNORMAL HIGH (ref 4.0–10.5)
nRBC: 0 % (ref 0.0–0.2)

## 2018-07-13 LAB — BASIC METABOLIC PANEL
Anion gap: 9 (ref 5–15)
BUN: 15 mg/dL (ref 6–20)
CO2: 23 mmol/L (ref 22–32)
Calcium: 9.4 mg/dL (ref 8.9–10.3)
Chloride: 106 mmol/L (ref 98–111)
Creatinine, Ser: 1.13 mg/dL (ref 0.61–1.24)
GFR calc Af Amer: >60 mL/min (ref >60)
GFR calc non Af Amer: >60 mL/min (ref >60)
Glucose, Bld: 94 mg/dL (ref 70–99)
Potassium: 3.7 mmol/L (ref 3.5–5.1)
Sodium: 138 mmol/L (ref 135–145)

## 2018-07-13 MED ORDER — CLINDAMYCIN PHOSPHATE 600 MG/50ML IV SOLN
600.0000 mg | Freq: Once | INTRAVENOUS | Status: AC
  Administered 2018-07-13: 600 mg via INTRAVENOUS
  Filled 2018-07-13: qty 50

## 2018-07-13 MED ORDER — CLINDAMYCIN HCL 300 MG PO CAPS: 300.0000 mg | ORAL_CAPSULE | Freq: Three times a day (TID) | ORAL | 0 refills | Status: AC

## 2018-07-13 MED ORDER — IOHEXOL 300 MG/ML  SOLN
75.0000 mL | Freq: Once | INTRAMUSCULAR | Status: AC | PRN
  Administered 2018-07-13: 75 mL via INTRAVENOUS
  Filled 2018-07-13: qty 75

## 2018-07-13 MED ORDER — LIDOCAINE-EPINEPHRINE-TETRACAINE (LET) SOLUTION
3.0000 mL | Freq: Once | NASAL | Status: DC
  Filled 2018-07-13: qty 3

## 2018-07-13 NOTE — ED Notes (Signed)
Patient discharged to home per MD order. Patient in stable condition, and deemed medically cleared by ED provider for discharge. Discharge instructions reviewed with patient/family using "Teach Back"; verbalized understanding of medication education and administration, and information about follow-up care. Denies further concerns. ° °

## 2018-07-13 NOTE — ED Triage Notes (Signed)
Patient c/o possible abscess to anterior neck X  2 days, with increasing pain today.

## 2018-07-13 NOTE — ED Provider Notes (Signed)
[facility] Emergency Department Provider Note  ____________________________________________  Time seen: Approximately 8:00 PM  I have reviewed the triage vital signs and the nursing notes.   HISTORY  Chief Complaint Abscess    HPI Benjamin Crane. is a 32 y.o. male with a history of substance abuse, presents to the emergency department with a 2 cm x 2 cm abscess along the midline anterior neck for the past two days that developed after shaving.  Patient reports pain with swallowing and moving the neck. Patient states "I feel like I'm being choked out".  Patient was diagnosed with a MRSA infection of the hands 2 months ago and received IV antibiotics but did not fill prescription for p.o. antibiotics at discharge. He denies fever and chills at home. No alleviating measures have been attempted.    Past Medical History:  Diagnosis Date  . ADHD   . Anxiety   . Cocaine abuse (HCC)   . Depression   . GERD (gastroesophageal reflux disease)     Patient Active Problem List   Diagnosis Date Noted  . 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 Cocaine abuse (HCC) 09/23/2016    Past Surgical History:  Procedure Laterality Date  . TONSILLECTOMY      Prior to Admission medications   Medication Sig Start Date End Date Taking? Authorizing Provider  busPIRone (BUSPAR) 15 MG tablet Take 15 mg by mouth 2 (two) times daily.    [provider]  clindamycin (CLEOCIN) 300 MG capsule Take 1 capsule (300 mg total) by mouth 3 (three) times daily for 10 days. 07/13/18 07/23/18  Orvil Feil, PA-C  escitalopram (LEXAPRO) 20 MG tablet Take 20 mg by mouth daily.    [provider]  pantoprazole (PROTONIX) 40 MG tablet Take 40 mg by mouth daily.    [provider]    Allergies Patient has no known allergies.  No family history on file.  Social  History Social History   Tobacco Use  . Smoking status: Current Every Day Smoker    Types: E-cigarettes  . Smokeless tobacco: Never Used  Substance Use Topics  . Alcohol use: No  . Drug use: Not Currently     Review of Systems  Constitutional: No fever/chills Eyes: No visual changes. No discharge ENT: No upper respiratory complaints. Cardiovascular: no chest pain. Respiratory: no cough. No SOB. Gastrointestinal: No abdominal pain.  No nausea, no vomiting.  No diarrhea.  No constipation. Musculoskeletal: Negative for musculoskeletal pain. Skin: Patient has abscess at neck.  Neurological: Negative for headaches, focal weakness or numbness.   ____________________________________________   PHYSICAL EXAM:  VITAL SIGNS: ED Triage Vitals [07/13/18 1906]  Enc Vitals Group     BP 125/78     Pulse Rate 90     Resp 18     Temp 97.9 F (36.6 C)     Temp src      SpO2 97 %     Weight 150 lb (68 kg)     Height 5\' 11"  (1.803 m)     Head Circumference      Peak Flow      Pain Score 5     Pain Loc      Pain Edu?      Excl. in GC?      Constitutional: Alert and oriented. Well appearing and in no acute distress. Eyes: Conjunctivae are normal. PERRL. EOMI. Head: Atraumatic.  Nose: No congestion/rhinnorhea.      Mouth/Throat: Mucous membranes are moist.  Patient is able to swallow on command. Neck: No stridor.  No cervical spine tenderness to palpation. Hematological/Lymphatic/Immunilogical: Palpable cervical lymphadenopathy. Cardiovascular: Normal rate, regular rhythm. Normal S1 and S2.  Good peripheral circulation. Respiratory: Normal respiratory effort without tachypnea or retractions. Lungs CTAB. Good air entry to the bases with no decreased or absent breath sounds. Gastrointestinal: Bowel sounds 4 quadrants. Soft and nontender to palpation. No guarding or rigidity. No palpable masses. No distention. No CVA tenderness. Musculoskeletal: Full range of motion to all  extremities. No gross deformities appreciated. Neurologic:  Normal speech and language. No gross focal neurologic deficits are appreciated.  Skin: Patient has a 2 cm x 2 cm palpable abscess at midline anterior neck with associated induration but no palpable fluctuance. Psychiatric: Mood and affect are normal. Speech and behavior are normal. Patient exhibits appropriate insight and judgement.   ____________________________________________   LABS (all labs ordered are listed, but only abnormal results are displayed)  Labs Reviewed  CBC WITH DIFFERENTIAL/PLATELET - Abnormal; Notable for the following components:      Result Value   WBC 13.0 (*)    Neutro Abs 9.5 (*)    All other components within normal limits  BASIC METABOLIC PANEL   ____________________________________________  EKG   ____________________________________________  RADIOLOGY I personally viewed and evaluated these images as part of my medical decision making, as well as reviewing the written report by the radiologist.    Ct Soft Tissue Neck W Contrast  Result Date: 07/13/2018 CLINICAL DATA:  32 y/o M; possible abscess to the anterior neck for 2 days with increasing pain. EXAM: CT NECK WITH CONTRAST TECHNIQUE: Multidetector CT imaging of the neck was performed using the standard protocol following the bolus administration of intravenous contrast. CONTRAST:  75mL OMNIPAQUE IOHEXOL 300 MG/ML  SOLN COMPARISON:  None. FINDINGS: Pharynx and larynx: Normal. No mass or swelling. Salivary glands: No inflammation, mass, or stone. Thyroid: Normal. Lymph nodes: None enlarged or abnormal density. Vascular: Negative. Limited intracranial: Negative. Visualized orbits: Negative. Mastoids and visualized paranasal sinuses: Clear. Skeleton: No acute or aggressive process. Upper chest: Negative. Other: Dermal abscess within the midline anterior mid neck measuring 7 x 9 x 19 mm (AP x ML x CC series 3, image 80 and series 7, image 42) with  surrounding inflammation within the subcutaneous fat. No extension of the abscess into the deep cervical compartments. IMPRESSION: Dermal abscess within the midline anterior mid neck measuring up to 19 mm with surrounding inflammation. No extension of abscess into deep cervical compartments. Electronically Signed   By: Mitzi HansenLance  Furusawa-Stratton M.D.   On: 07/13/2018 21:33    ____________________________________________    PROCEDURES  Procedure(s) performed:    Procedures  INCISION AND DRAINAGE Performed by: Orvil FeilJaclyn M Rex Magee Consent: Verbal consent obtained. Risks and benefits: risks, benefits and alternatives were discussed Type: abscess  Body area: Anterior Neck   Anesthesia: LET  Needle Aspiration  Complexity: complex Blunt dissection to break up loculations  Drainage: purulent  Drainage amount: Bloody with white purulent exudate.   Patient tolerance: Patient tolerated the procedure well with no immediate complications.     Medications  lidocaine-EPINEPHrine-tetracaine (LET) solution (has no administration in time range)  clindamycin (CLEOCIN) IVPB 600 mg (600 mg Intravenous New Bag/Given 07/13/18 2255)  iohexol (OMNIPAQUE) 300 MG/ML solution 75 mL (75 mLs Intravenous Contrast Given 07/13/18 2057)     ____________________________________________   INITIAL IMPRESSION / ASSESSMENT AND PLAN /  ED COURSE  Pertinent labs & imaging results that were available during my care of the patient were reviewed by me and considered in my medical decision making (see chart for details).  Review of the [place] CSRS was performed in accordance of the [facility] prior to dispensing any controlled drugs.      Assessment and Plan: Abscess:  Patient presents to the emergency department with an anterior neck abscess that has been present for the past 2 days.  CT reveals a 2 cm neck abscess. Patient underwent needle aspiration in the emergency department which yielded some purulent drainage.  Patient was given IV clindamycin in the emergency department and was discharged with clindamycin. Strict return precautions were given to return with new or worsening symptoms. Vital signs were reassuring prior to discharge. All patient questions were answered.   ____________________________________________  FINAL CLINICAL IMPRESSION(S) / ED DIAGNOSES  Final diagnoses:  Abscess      NEW MEDICATIONS STARTED DURING THIS VISIT:  ED Discharge Orders         Ordered    clindamycin (CLEOCIN) 300 MG capsule  3 times daily     07/13/18 2310              This chart was dictated using voice recognition software/Dragon. Despite best efforts to proofread, errors can occur which can change the meaning. Any change was purely unintentional.    Orvil FeilWoods, Maykayla Highley M, PA-C 07/13/18 2317    Arnaldo NatalMalinda, Paul F, MD 07/14/18 [phone]

## 2018-07-13 NOTE — ED Notes (Signed)
PA at the bedside for pt evaluation 

## 2019-01-22 ENCOUNTER — Emergency Department
Admission: EM | Admit: 2019-01-22 | Discharge: 2019-01-22 | Disposition: A | Discharge status: Home / Self Care | Payer: Medicaid Other | Attending: Emergency Medicine | Admitting: Emergency Medicine

## 2019-01-22 ENCOUNTER — Other Ambulatory Visit: Payer: Self-pay

## 2019-01-22 ENCOUNTER — Encounter: Payer: Self-pay | Admitting: Emergency Medicine

## 2019-01-22 DIAGNOSIS — Z5321 Procedure and treatment not carried out due to patient leaving prior to being seen by health care provider: Secondary | ICD-10-CM | POA: Insufficient documentation

## 2019-01-22 DIAGNOSIS — H5711 Ocular pain, right eye: Secondary | ICD-10-CM | POA: Insufficient documentation

## 2019-01-22 MED ORDER — TETRACAINE HCL 0.5 % OP SOLN
2.0000 [drp] | Freq: Once | OPHTHALMIC | Status: DC
  Filled 2019-01-22: qty 4

## 2019-01-22 MED ORDER — FLUORESCEIN SODIUM 1 MG OP STRP
1.0000 | ORAL_STRIP | Freq: Once | OPHTHALMIC | Status: DC
  Filled 2019-01-22: qty 1

## 2019-01-22 NOTE — ED Notes (Signed)
See triage note  Presents with pain to right eye  Denies any injury  But has had some swelling to eye

## 2019-01-22 NOTE — ED Triage Notes (Signed)
Pt reports discomfort to his right eye noticed yesterday. Swelling/reddness seen to the inner portion of eyelid

## 2019-01-22 NOTE — ED Notes (Signed)
Pt not in room for provider   

## 2019-04-12 ENCOUNTER — Encounter: Payer: Self-pay | Admitting: Emergency Medicine

## 2019-04-12 ENCOUNTER — Other Ambulatory Visit: Payer: Self-pay

## 2019-04-12 ENCOUNTER — Emergency Department
Admission: EM | Admit: 2019-04-12 | Discharge: 2019-04-12 | Disposition: A | Discharge status: Home / Self Care | Payer: Medicaid Other | Attending: Emergency Medicine | Admitting: Emergency Medicine

## 2019-04-12 ENCOUNTER — Emergency Department: Payer: Medicaid Other

## 2019-04-12 DIAGNOSIS — Y939 Activity, unspecified: Secondary | ICD-10-CM | POA: Insufficient documentation

## 2019-04-12 DIAGNOSIS — Y999 Unspecified external cause status: Secondary | ICD-10-CM | POA: Insufficient documentation

## 2019-04-12 DIAGNOSIS — Z79899 Other long term (current) drug therapy: Secondary | ICD-10-CM | POA: Insufficient documentation

## 2019-04-12 DIAGNOSIS — F17210 Nicotine dependence, cigarettes, uncomplicated: Secondary | ICD-10-CM | POA: Insufficient documentation

## 2019-04-12 DIAGNOSIS — S20211A Contusion of right front wall of thorax, initial encounter: Secondary | ICD-10-CM | POA: Insufficient documentation

## 2019-04-12 DIAGNOSIS — Y929 Unspecified place or not applicable: Secondary | ICD-10-CM | POA: Insufficient documentation

## 2019-04-12 DIAGNOSIS — W2209XA Striking against other stationary object, initial encounter: Secondary | ICD-10-CM | POA: Insufficient documentation

## 2019-04-12 MED ORDER — TRAMADOL HCL 50 MG PO TABS: 50.0000 mg | ORAL_TABLET | Freq: Four times a day (QID) | ORAL | 0 refills | Status: DC | PRN

## 2019-04-12 MED ORDER — IBUPROFEN 600 MG PO TABS: 600.0000 mg | ORAL_TABLET | Freq: Three times a day (TID) | ORAL | 0 refills | Status: DC | PRN

## 2019-04-12 MED ORDER — LIDOCAINE 5 % EX PTCH
1.0000 | MEDICATED_PATCH | CUTANEOUS | Status: DC
  Administered 2019-04-12: 1 via TRANSDERMAL
  Filled 2019-04-12: qty 1

## 2019-04-12 NOTE — ED Provider Notes (Signed)
[facility] Emergency Department Provider Note   ____________________________________________   First MD Initiated Contact with Patient 04/12/19 1732     (approximate)  I have reviewed the triage vital signs and the nursing notes.   HISTORY  Chief Complaint Chest Pain    HPI Benjamin Delima. is a 33 y.o. male patient complain right lateral rib pain secondary to contusion against a trailer 3 days ago.  Patient states pain increased with deep inspirations.  Patient described the pain as "achy".  No palliative measure for complaint.         Past Medical History:  Diagnosis Date  . ADHD   . Anxiety   . Cocaine abuse ([place])   . Depression   . GERD (gastroesophageal reflux disease)     Patient Active Problem List   Diagnosis Date Noted  . 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 Cocaine abuse ([place]) 09/23/2016    Past Surgical History:  Procedure Laterality Date  . TONSILLECTOMY      Prior to Admission medications   Medication Sig Start Date End Date Taking? Authorizing Provider  busPIRone (BUSPAR) 15 MG tablet Take 15 mg by mouth 2 (two) times daily.    [provider]  escitalopram (LEXAPRO) 20 MG tablet Take 20 mg by mouth daily.    [provider]  ibuprofen (ADVIL) 600 MG tablet Take 1 tablet (600 mg total) by mouth every 8 (eight) hours as needed. 04/12/19   Sable Feil, PA-C  pantoprazole (PROTONIX) 40 MG tablet Take 40 mg by mouth daily.    [provider]  traMADol (ULTRAM) 50 MG tablet Take 1 tablet (50 mg total) by mouth every 6 (six) hours as needed for moderate pain. 04/12/19   Sable Feil, PA-C    Allergies Patient has no known allergies.  No family history on file.  Social History Social History   Tobacco Use  . Smoking status: Current Every Day Smoker    Packs/day: 0.25    Types: E-cigarettes,  Cigarettes  . Smokeless tobacco: Never Used  Substance Use Topics  . Alcohol use: No  . Drug use: Not Currently    Types: Cocaine    Review of Systems Constitutional: No fever/chills Eyes: No visual changes. ENT: No sore throat. Cardiovascular: Denies chest pain. Respiratory: Denies shortness of breath. Gastrointestinal: No abdominal pain.  No nausea, no vomiting.  No diarrhea.  No constipation. Genitourinary: Negative for dysuria. Musculoskeletal: Right lateral chest wall pain.  Skin: Negative for rash. Neurological: Negative for headaches, focal weakness or numbness. Psychiatric:  ADD and polysubstance abuse.  ____________________________________________   PHYSICAL EXAM:  VITAL SIGNS: ED Triage Vitals  Enc Vitals Group     BP 04/12/19 1714 116/70     Pulse Rate 04/12/19 1714 79     Resp 04/12/19 1714 16     Temp 04/12/19 1714 97.8 F (36.6 C)     Temp Source 04/12/19 1714 Oral     SpO2 04/12/19 1714 100 %     Weight 04/12/19 1715 150 lb (68 kg)     Height 04/12/19 1715 5\' 10"  (1.778 m)     Head Circumference --      Peak Flow --      Pain Score 04/12/19 1715 0     Pain Loc --      Pain Edu? --      Excl. in [place]? --  Constitutional: Alert and oriented. Well appearing and in no acute distress. Cardiovascular: Normal rate, regular rhythm. Grossly normal heart sounds.  Good peripheral circulation. Respiratory: Normal respiratory effort.  No retractions. Lungs CTAB. Gastrointestinal: Soft and nontender. No distention. No abdominal bruits. No CVA tenderness. Neurologic:  Normal speech and language. No gross focal neurologic deficits are appreciated. No gait instability. Skin:  Skin is warm, dry and intact. No rash noted. Psychiatric: Mood and affect are normal. Speech and behavior are normal.  ____________________________________________   LABS (all labs ordered are listed, but only abnormal results are displayed)  Labs Reviewed - No data to display  ____________________________________________  EKG   ____________________________________________  RADIOLOGY  ED MD interpretation:    Official radiology report(s): Dg Chest 2 View  Result Date: 04/12/2019 CLINICAL DATA:  Status post fall. EXAM: CHEST - 2 VIEW COMPARISON:  None. FINDINGS: The heart size and mediastinal contours are within normal limits. Both lungs are clear. The visualized skeletal structures are unremarkable. IMPRESSION: No active cardiopulmonary disease. Electronically Signed   By: Ted Mcalpineobrinka  Dimitrova M.D.   On: 04/12/2019 18:06    ____________________________________________   PROCEDURES  Procedure(s) performed (including Critical Care):  Procedures   ____________________________________________   INITIAL IMPRESSION / ASSESSMENT AND PLAN / ED COURSE  As part of my medical decision making, I reviewed the following data within the electronic MEDICAL RECORD NUMBER         Benjamin MassonLawrence L Thalman Jr. was evaluated in Emergency Department on 04/12/2019 for the symptoms described in the history of present illness. He was evaluated in the context of the global COVID-19 pandemic, which necessitated consideration that the patient might be at risk for infection with the SARS-CoV-2 virus that causes COVID-19. Institutional protocols and algorithms that pertain to the evaluation of patients at risk for COVID-19 are in a state of rapid change based on information released by regulatory bodies including the CDC and federal and state organizations. These policies and algorithms were followed during the patient's care in the ED.   Patient complain of right lateral rib pain secondary to contusion.  Discussed neck x-ray findings with patient.  Patient given discharge care instruction advised take medication as directed.  Patient advised [facility].      ____________________________________________   FINAL CLINICAL IMPRESSION(S) / ED DIAGNOSES   Final diagnoses:  Rib contusion, right, initial encounter     ED Discharge Orders         Ordered    traMADol (ULTRAM) 50 MG tablet  Every 6 hours PRN     04/12/19 1813    ibuprofen (ADVIL) 600 MG tablet  Every 8 hours PRN     04/12/19 1813           Note:  This document was prepared using Dragon voice recognition software and may include unintentional dictation errors.    Joni ReiningSmith,  K, PA-C 04/12/19 1818    Concha SeFunke, Mary E, MD 04/12/19 [id number]

## 2019-04-12 NOTE — ED Notes (Addendum)
Pt fell into trailer a few days ago, SOB and pain has increased when moving. At rest not much pain. Pt states right side rib cage hurts intermittently.

## 2019-04-12 NOTE — ED Triage Notes (Signed)
Pt in via POV, reports mechanical fall up against a trailer a few days ago, presents with right rib pain, states pain has worsened over the last few days with some shortness of breath developing.  Vitals WDL, NAD noted at this time.

## 2019-10-05 ENCOUNTER — Other Ambulatory Visit: Payer: Self-pay

## 2019-10-05 ENCOUNTER — Emergency Department
Admission: EM | Admit: 2019-10-05 | Discharge: 2019-10-07 | Disposition: A | Discharge status: Other Acute Inpatient Hospital | Payer: Medicaid Other | Attending: Emergency Medicine | Admitting: Emergency Medicine

## 2019-10-05 DIAGNOSIS — F419 Anxiety disorder, unspecified: Secondary | ICD-10-CM | POA: Insufficient documentation

## 2019-10-05 DIAGNOSIS — F329 Major depressive disorder, single episode, unspecified: Secondary | ICD-10-CM | POA: Insufficient documentation

## 2019-10-05 DIAGNOSIS — F19959 Other psychoactive substance use, unspecified with psychoactive substance-induced psychotic disorder, unspecified: Secondary | ICD-10-CM | POA: Diagnosis present

## 2019-10-05 DIAGNOSIS — F29 Unspecified psychosis not due to a substance or known physiological condition: Secondary | ICD-10-CM | POA: Insufficient documentation

## 2019-10-05 DIAGNOSIS — Z20822 Contact with and (suspected) exposure to covid-19: Secondary | ICD-10-CM | POA: Insufficient documentation

## 2019-10-05 DIAGNOSIS — Z79899 Other long term (current) drug therapy: Secondary | ICD-10-CM | POA: Insufficient documentation

## 2019-10-05 DIAGNOSIS — R45851 Suicidal ideations: Secondary | ICD-10-CM

## 2019-10-05 DIAGNOSIS — F1410 Cocaine abuse, uncomplicated: Secondary | ICD-10-CM | POA: Insufficient documentation

## 2019-10-05 DIAGNOSIS — F17210 Nicotine dependence, cigarettes, uncomplicated: Secondary | ICD-10-CM | POA: Insufficient documentation

## 2019-10-05 LAB — URINE DRUG SCREEN, QUALITATIVE (ARMC ONLY)
Amphetamines, Ur Screen: NOT DETECTED
Barbiturates, Ur Screen: NOT DETECTED
Benzodiazepine, Ur Scrn: NOT DETECTED
Cannabinoid 50 Ng, Ur ~~LOC~~: NOT DETECTED
Cocaine Metabolite,Ur ~~LOC~~: NOT DETECTED
MDMA (Ecstasy)Ur Screen: NOT DETECTED
Methadone Scn, Ur: NOT DETECTED
Opiate, Ur Screen: NOT DETECTED
Phencyclidine (PCP) Ur S: NOT DETECTED
Tricyclic, Ur Screen: NOT DETECTED

## 2019-10-05 LAB — COMPREHENSIVE METABOLIC PANEL
ALT: 28 U/L (ref 0–44)
AST: 22 U/L (ref 15–41)
Albumin: 4.2 g/dL (ref 3.5–5.0)
Alkaline Phosphatase: 98 U/L (ref 38–126)
Anion gap: 8 (ref 5–15)
BUN: 15 mg/dL (ref 6–20)
CO2: 28 mmol/L (ref 22–32)
Calcium: 9 mg/dL (ref 8.9–10.3)
Chloride: 102 mmol/L (ref 98–111)
Creatinine, Ser: 1.03 mg/dL (ref 0.61–1.24)
GFR calc Af Amer: >60 mL/min (ref >60)
GFR calc non Af Amer: >60 mL/min (ref >60)
Glucose, Bld: 120 mg/dL — ABNORMAL HIGH (ref 70–99)
Potassium: 3.9 mmol/L (ref 3.5–5.1)
Sodium: 138 mmol/L (ref 135–145)
Total Bilirubin: 0.4 mg/dL (ref 0.3–1.2)
Total Protein: 6.7 g/dL (ref 6.5–8.1)

## 2019-10-05 LAB — CBC
HCT: 44.1 % (ref 39.0–52.0)
Hemoglobin: 14.7 g/dL (ref 13.0–17.0)
MCH: 28.9 pg (ref 26.0–34.0)
MCHC: 33.3 g/dL (ref 30.0–36.0)
MCV: 86.8 fL (ref 80.0–100.0)
Platelets: 211 10*3/uL (ref 150–400)
RBC: 5.08 MIL/uL (ref 4.22–5.81)
RDW: 12.3 % (ref 11.5–15.5)
WBC: 7.3 10*3/uL (ref 4.0–10.5)
nRBC: 0 % (ref 0.0–0.2)

## 2019-10-05 LAB — ETHANOL: Alcohol, Ethyl (B): <10 mg/dL (ref <10)

## 2019-10-05 LAB — ACETAMINOPHEN LEVEL: Acetaminophen (Tylenol), Serum: <10 ug/mL — ABNORMAL LOW (ref 10–30)

## 2019-10-05 LAB — SALICYLATE LEVEL: Salicylate Lvl: <7 mg/dL — ABNORMAL LOW (ref 7.0–30.0)

## 2019-10-05 NOTE — ED Triage Notes (Signed)
Patient brought to ED via [facility] under IVC.  IVC papers were taken out by [facility].

## 2019-10-05 NOTE — ED Notes (Signed)
Brown colored boots, blue colored long sleeve t-shirt, blue colored pants, white colored socks, black colored boxers - placed in labeled belongings bag to be secured on unit.

## 2019-10-05 NOTE — ED Provider Notes (Signed)
[facility] Emergency Department Provider Note   ____________________________________________   First MD Initiated Contact with Patient 10/05/19 2312     (approximate)  I have reviewed the triage vital signs and the nursing notes.   HISTORY  Chief Complaint Psychiatric Evaluation    HPI Benjamin Crane. is a 34 y.o. male with past medical history of polysubstance abuse and depression who presents to the ED for psychiatric evaluation.  Patient was initially evaluated at [facility] and Per IVC paperwork, he had expressed thoughts of wanting to end his life and at one point held a loaded gun to his head.  His father was reportedly able to talk him down and he was brought to [facility] in police custody.  Patient currently denies this, states there was no gun and he has not been having any thoughts of harming himself.  He states that he was trying to talk to his dad, but "it is easier for my dad to shoot me off".  He denies any alcohol or drug abuse, also denies any medical complaints at this time.        Past Medical History:  Diagnosis Date  . ADHD   . Anxiety   . Cocaine abuse ([place])   . Depression   . GERD (gastroesophageal reflux disease)     Patient Active Problem List   Diagnosis Date Noted  . 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 Cocaine abuse ([place]) 09/23/2016    Past Surgical History:  Procedure Laterality Date  . TONSILLECTOMY      Prior to Admission medications   Medication Sig Start Date End Date Taking? Authorizing Provider  busPIRone (BUSPAR) 15 MG tablet Take 15 mg by mouth 2 (two) times daily.    [provider]  escitalopram (LEXAPRO) 20 MG tablet Take 20 mg by mouth daily.    [provider]  ibuprofen (ADVIL) 600 MG tablet Take 1 tablet (600 mg total) by mouth every 8 (eight) hours as needed. 04/12/19   Sable Feil, PA-C    pantoprazole (PROTONIX) 40 MG tablet Take 40 mg by mouth daily.    [provider]  traMADol (ULTRAM) 50 MG tablet Take 1 tablet (50 mg total) by mouth every 6 (six) hours as needed for moderate pain. 04/12/19   Sable Feil, PA-C    Allergies Patient has no known allergies.  No family history on file.  Social History Social History   Tobacco Use  . Smoking status: Current Every Day Smoker    Packs/day: 0.25    Types: E-cigarettes, Cigarettes  . Smokeless tobacco: Never Used  Substance Use Topics  . Alcohol use: No  . Drug use: Not Currently    Types: Cocaine    Review of Systems  Constitutional: No fever/chills Eyes: No visual changes. ENT: No sore throat. Cardiovascular: Denies chest pain. Respiratory: Denies shortness of breath. Gastrointestinal: No abdominal pain.  No nausea, no vomiting.  No diarrhea.  No constipation. Genitourinary: Negative for dysuria. Musculoskeletal: Negative for back pain. Skin: Negative for rash. Neurological: Negative for headaches, focal weakness or numbness.  Positive for depression.  ____________________________________________   PHYSICAL EXAM:  VITAL SIGNS: ED Triage Vitals  Enc Vitals Group     BP 10/05/19 2104 111/79     Pulse Rate 10/05/19 2104 79     Resp 10/05/19 2104 18     Temp 10/05/19 2104 98 F (36.7 C)  Temp Source 10/05/19 2104 Oral     SpO2 10/05/19 2104 98 %     Weight 10/05/19 2102 160 lb (72.6 kg)     Height 10/05/19 2102 5\' 10"  (1.778 m)     Head Circumference --      Peak Flow --      Pain Score 10/05/19 2102 0     Pain Loc --      Pain Edu? --      Excl. in [place]? --     Constitutional: Alert and oriented. Eyes: Conjunctivae are normal. Head: Atraumatic. Nose: No congestion/rhinnorhea. Mouth/Throat: Mucous membranes are moist. Neck: Normal ROM Cardiovascular: Normal rate, regular rhythm. Grossly normal heart sounds. Respiratory: Normal respiratory effort.  No retractions. Lungs  CTAB. Gastrointestinal: Soft and nontender. No distention. Genitourinary: deferred Musculoskeletal: No lower extremity tenderness nor edema. Neurologic:  Normal speech and language. No gross focal neurologic deficits are appreciated. Skin:  Skin is warm, dry and intact. No rash noted. Psychiatric: Mood and affect are normal. Speech and behavior are normal.  ____________________________________________   LABS (all labs ordered are listed, but only abnormal results are displayed)  Labs Reviewed  COMPREHENSIVE METABOLIC PANEL - Abnormal; Notable for the following components:      Result Value   Glucose, Bld 120 (*)    All other components within normal limits  SALICYLATE LEVEL - Abnormal; Notable for the following components:   Salicylate Lvl <7.0 (*)    All other components within normal limits  ACETAMINOPHEN LEVEL - Abnormal; Notable for the following components:   Acetaminophen (Tylenol), Serum <10 (*)    All other components within normal limits  ETHANOL  CBC  URINE DRUG SCREEN, QUALITATIVE ([facility] ONLY)     PROCEDURES  Procedure(s) performed (including Critical Care):  Procedures   ____________________________________________   INITIAL IMPRESSION / ASSESSMENT AND PLAN / ED COURSE       34 year old male presents to the ED under IVC for reported suicidal ideation and incident where he held a loaded gun to his head and threatened to end his life.  He is calm and cooperative here in the ED currently and denies any thoughts of harming himself or there even being an incident with a gun.  He denies any medical complaints at this time and is medically cleared.  He is pending further evaluation by psychiatry.      ____________________________________________   FINAL CLINICAL IMPRESSION(S) / ED DIAGNOSES  Final diagnoses:  Suicidal ideation     ED Discharge Orders    None       Note:  This document was prepared using Dragon voice recognition software and may  include unintentional dictation errors.   48, MD 10/06/19 [phone]

## 2019-10-06 DIAGNOSIS — R45851 Suicidal ideations: Secondary | ICD-10-CM | POA: Insufficient documentation

## 2019-10-06 DIAGNOSIS — F29 Unspecified psychosis not due to a substance or known physiological condition: Secondary | ICD-10-CM | POA: Diagnosis present

## 2019-10-06 DIAGNOSIS — F19959 Other psychoactive substance use, unspecified with psychoactive substance-induced psychotic disorder, unspecified: Secondary | ICD-10-CM | POA: Diagnosis present

## 2019-10-06 LAB — RESPIRATORY PANEL BY RT PCR (FLU A&B, COVID)
Influenza A by PCR: NEGATIVE
Influenza B by PCR: NEGATIVE
SARS Coronavirus 2 by RT PCR: NEGATIVE

## 2019-10-06 NOTE — Consult Note (Signed)
**Note De-Identified Benjamin Obfuscation** [facility] Face-to-[facility]ry Consult   Reason for Consult:  Psych evaluation Referring Physician:  Dr. Larinda [facility] Patient Identification: [facility]. MRN:  [id number] Principal Diagnosis: <principal problem not specified> Diagnosis:  Active Problems:   * No active hospital problems. *   Total Time spent with patient: 45 minutes  Subjective:   [facility]. is a 34 y.o. male Patient brought to ED [facility] under IVC.  IVC papers were taken out by [facility]. " I'm here because my dad said I wanted to kill myself, that what he does to get me out the way"  HPI:  Per  EDP: [facility]. is a 34 y.o. male with past medical history of polysubstance abuse and depression who presents to the ED for psychiatric evaluation.  Patient was initially evaluated at [facility] and Per IVC paperwork, he had expressed thoughts of wanting to end his life and at one point held a loaded gun to his head.  His father was reportedly able to talk him down and he was brought to [facility] in police custody.  Patient currently denies this, states there was no gun and he has not been having any thoughts of harming himself.  He states that he was trying to talk to his dad, but "it is easier for my dad to shoot me off".  He denies any alcohol or drug abuse, also denies any medical complaints at this time.   [facility]., 34 y.o., male patient seenface to face by this provider; chart reviewed and consulted with [facility] on 10/06/19.  On evaluation [facility]. reports  That he is here because his father didn't want to be bothered with him. He says he is the black sheep of the family.  Fater stated that the patient was doing drugs and put a gun to hs head. Patient denoes all of theses allegations and his UDS was clear of any illicit drug use. Patient state that the only drugs he takes are Wellbutrin and Celexa for depression and anxiety.  Patient states that he is med compliant and  that he see his therapist weekly at [facility]. He denies any hx of abuse and presents with a lot of insight.  During evaluation [facility]. is sleeping in the hallway chair, he is easily awen when name is called; he is alert/oriented x 4; calm/cooperative; and mood congruent with affect.  Patient is speaking in a clear tone at moderate volume, and normal pace; with good eye contact.  His thought process is coherent and relevant; There is no indication that he is currently responding to internal/external stimuli or experiencing delusional thought content.  Patient denies suicidal/self-harm/homicidal ideation, psychosis, and paranoia.  Patient has remained calm throughout assessment and has answered questions appropriately.   Recommendation:  Unable to reach dad for collateral information. If patient remains psychiatrically statble after reassessment, then recommend DC in this am.    Past Psychiatric History: anxiety and depression  Risk to Self: Suicidal Ideation: No Suicidal Intent: No Is patient at risk for suicide?: No, but patient needs Medical Clearance Suicidal Plan?: No Access to Means: No What has been your use of drugs/alcohol within the last 12 months?: Hx of use  How many times?: 0 Other Self Harm Risks: none  Intentional Self Injurious Behavior: None Risk to Others: Homicidal Ideation: No Thoughts of Harm to Others: No Current Homicidal Intent: No Current Homicidal Plan: No Access to Homicidal Means: No History  of harm to others?: No Assessment of Violence: None Noted Does patient have access to weapons?: No Criminal Charges Pending?: No Does patient have a court date: No Prior Inpatient Therapy: Prior Inpatient Therapy: No Prior Outpatient Therapy: Prior Outpatient Therapy: Yes Prior Therapy Dates: current;y, one month  Prior Therapy Facilty/Provider(s): [facility] Reason for Treatment: Depression  Does patient have an ACCT team?: No Does patient have Intensive  In-House Services?  : No Does patient have [facility] services? : No Does patient have P4CC services?: No  Past Medical History:  Past Medical History:  Diagnosis Date  . ADHD   . Anxiety   . Cocaine abuse ([facility])   . Depression   . GERD (gastroesophageal reflux disease)     Past Surgical History:  Procedure Laterality Date  . TONSILLECTOMY     Family History: No family history on file. Family Psychiatric  History: unknown Social History:  Social History   Substance and Sexual Activity  Alcohol Use No     Social History   Substance and Sexual Activity  Drug Use Not Currently  . Types: Cocaine    Social History   Socioeconomic History  . Marital status: Married    Spouse name: Not on file  . Number of children: Not on file  . Years of education: Not on file  . Highest education level: Not on file  Occupational History  . Not on file  Tobacco Use  . Smoking status: Current Every Day Smoker    Packs/day: 0.25    Types: E-cigarettes, Cigarettes  . Smokeless tobacco: Never Used  Substance and Sexual Activity  . Alcohol use: No  . Drug use: Not Currently    Types: Cocaine  . Sexual activity: Not on file  Other Topics Concern  . Not on file  Social History Narrative  . Not on file   Social Determinants of Health   Financial Resource Strain:   . Difficulty of Paying Living Expenses:   Food Insecurity:   . Worried About Charity fundraiser in the Last Year:   . Arboriculturist in the Last Year:   Transportation Needs:   . Film/video editor (Medical):   [facility] Kitchen Lack of Transportation (Non-Medical):   Physical Activity:   . Days of Exercise per Week:   . Minutes of Exercise per Session:   Stress:   . Feeling of Stress :   Social Connections:   . Frequency of Communication with Friends and Family:   . Frequency of Social Gatherings with Friends and Family:   . Attends Religious Services:   . Active Member of Clubs or Organizations:   . Attends Theatre manager Meetings:   [facility] Kitchen Marital Status:    Additional Social History:    Allergies:  No Known Allergies  Labs:  Results for orders placed or performed during the hospital encounter of 10/05/19 (from the past 48 hour(s))  Comprehensive metabolic panel     Status: Abnormal   Collection Time: 10/05/19  9:10 PM  Result Value Ref Range   Sodium 138 135 - 145 mmol/L   Potassium 3.9 3.5 - 5.1 mmol/L   Chloride 102 98 - 111 mmol/L   CO2 28 22 - 32 mmol/L   Glucose, Bld 120 (H) 70 - 99 mg/dL    Comment: Glucose reference range applies only to samples taken after fasting for at least 8 hours.   BUN 15 6 - 20 mg/dL   Creatinine, Ser 1.03 0.61 -  1.24 mg/dL   Calcium 9.0 8.9 - 18.2 mg/dL   Total Protein 6.7 6.5 - 8.1 g/dL   Albumin 4.2 3.5 - 5.0 g/dL   AST 22 15 - 41 U/L   ALT 28 0 - 44 U/L   Alkaline Phosphatase 98 38 - 126 U/L   Total Bilirubin 0.4 0.3 - 1.2 mg/dL   GFR calc non Af Amer >60 >60 mL/min   GFR calc Af Amer >60 >60 mL/min   Anion gap 8 5 - 15    Comment: Performed at [facility], [address], [place], [place] [address]  Ethanol     Status: None   Collection Time: 10/05/19  9:10 PM  Result Value Ref Range   Alcohol, Ethyl (B) <10 <10 mg/dL    Comment: (NOTE) Lowest detectable limit for serum alcohol is 10 mg/dL. For medical purposes only. Performed at [facility], [address] [place], [place] [address]   Salicylate level     Status: Abnormal   Collection Time: 10/05/19  9:10 PM  Result Value Ref Range   Salicylate Lvl <7.0 (L) 7.0 - 30.0 mg/dL    Comment: Performed at [facility], [address] [place], [place] [address]  Acetaminophen level     Status: Abnormal   Collection Time: 10/05/19  9:10 PM  Result Value Ref Range   Acetaminophen (Tylenol), Serum <10 (L) 10 - 30 ug/mL    Comment: (NOTE) Therapeutic concentrations vary significantly. A range of 10-30 ug/mL  may be an effective concentration for many patients.  However, some  are best treated at concentrations outside of this range. Acetaminophen concentrations >150 ug/mL at 4 hours after ingestion  and >50 ug/mL at 12 hours after ingestion are often associated with  toxic reactions. Performed at [facility], [address] [place], [place] [address]   cbc     Status: None   Collection Time: 10/05/19  9:10 PM  Result Value Ref Range   WBC 7.3 4.0 - 10.5 K/uL   RBC 5.08 4.22 - 5.81 MIL/uL   Hemoglobin 14.7 13.0 - 17.0 g/dL   HCT 25.8 52.7 - 78.2 %   MCV 86.8 80.0 - 100.0 fL   MCH 28.9 26.0 - 34.0 pg   MCHC 33.3 30.0 - 36.0 g/dL   RDW 42.3 53.6 - 14.4 %   Platelets 211 150 - 400 K/uL   nRBC 0.0 0.0 - 0.2 %    Comment: Performed at [facility], [address] [place], [place] [address]  Urine Drug Screen, Qualitative     Status: None   Collection Time: 10/05/19  9:11 PM  Result Value Ref Range   Tricyclic, Ur Screen NONE DETECTED NONE DETECTED   Amphetamines, Ur Screen NONE DETECTED NONE DETECTED   MDMA (Ecstasy)Ur Screen NONE DETECTED NONE DETECTED   Cocaine Metabolite,Ur [place] NONE DETECTED NONE DETECTED   Opiate, Ur Screen NONE DETECTED NONE DETECTED   Phencyclidine (PCP) Ur S NONE DETECTED NONE DETECTED   Cannabinoid 50 Ng, Ur [place] NONE DETECTED NONE DETECTED   Barbiturates, Ur Screen NONE DETECTED NONE DETECTED   Benzodiazepine, Ur Scrn NONE DETECTED NONE DETECTED   Methadone Scn, Ur NONE DETECTED NONE DETECTED    Comment: (NOTE) Tricyclics + metabolites, urine    Cutoff 1000 ng/mL Amphetamines + metabolites, urine  Cutoff 1000 ng/mL MDMA (Ecstasy), urine              Cutoff 500 ng/mL Cocaine Metabolite, urine  Cutoff 300 ng/mL Opiate + metabolites, urine        Cutoff 300 ng/mL Phencyclidine (PCP), urine         Cutoff 25 ng/mL Cannabinoid, urine                 Cutoff 50 ng/mL Barbiturates + metabolites, urine  Cutoff 200 ng/mL Benzodiazepine, urine              Cutoff 200 ng/mL Methadone, urine                    Cutoff 300 ng/mL The urine drug screen provides only a preliminary, unconfirmed analytical test result and should not be used for non-medical purposes. Clinical consideration and professional judgment should be applied to any positive drug screen result due to possible interfering substances. A more specific alternate chemical method must be used in order to obtain a confirmed analytical result. Gas chromatography / mass spectrometry (GC/MS) is the preferred confirmat ory method. Performed at [facility], [address] [place], [place] [address]     No current facility-administered medications for this encounter.   Current Outpatient Medications  Medication Sig Dispense Refill  . busPIRone (BUSPAR) 15 MG tablet Take 15 mg by mouth 2 (two) times daily.    [facility] Kitchen escitalopram (LEXAPRO) 20 MG tablet Take 20 mg by mouth daily.    [facility] Kitchen ibuprofen (ADVIL) 600 MG tablet Take 1 tablet (600 mg total) by mouth every 8 (eight) hours as needed. 15 tablet 0  . pantoprazole (PROTONIX) 40 MG tablet Take 40 mg by mouth daily.    . traMADol (ULTRAM) 50 MG tablet Take 1 tablet (50 mg total) by mouth every 6 (six) hours as needed for moderate pain. 12 tablet 0    Musculoskeletal: Strength & Muscle Tone: within normal limits Gait & Station: normal Patient leans: N/A  Psychiatric Specialty Exam: Physical Exam  Review of Systems  Blood pressure 111/79, pulse 79, temperature 98 F (36.7 C), temperature source Oral, resp. rate 18, height 5\' 10"  (1.778 m), weight 72.6 kg, SpO2 98 %.Body mass index is 22.96 kg/m.  General Appearance: Casual  Eye Contact:  Good  Speech:  Clear and Coherent  Volume:  Normal  Mood:  Euthymic  Affect:  Congruent  Thought Process:  Coherent and Descriptions of Associations: Intact  Orientation:  Full (Time, Place, and Person)  Thought Content:  Logical  Suicidal Thoughts:  No  Homicidal Thoughts:  No  Memory:  Immediate;   Fair  Judgement:  Good   Insight:  Good  Psychomotor Activity:  Normal  Concentration:  Concentration: Good  Recall:  Good  Fund of Knowledge:  Good  Language:  Good  Akathisia:  NA  Handed:  Right  AIMS (if indicated):     Assets:  Communication Skills Desire for Improvement  ADL's:  Intact  Cognition:  WNL  Sleep:        Treatment Plan Summary: reassess inthe am after speaking to to dad. Discharge in the am if patient remains psychiatrically stable.  Disposition: No evidence of imminent risk to self or others at present.   DC if patient remais psychiatrically stable  , NP 10/06/2019 6:34 AM
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
## 2019-10-06 NOTE — ED Notes (Signed)
Introduced self to pt. Pt oriented to unit. Pt given blanket. Pt denies further needs at this time.

## 2019-10-06 NOTE — ED Notes (Signed)
RN has spoken with Psych NP Reola Calkins, Psych NP is not D/C patient at this time due to conflicting information. TTS and Psych NP have attempted and are still attempting to get in touch via telephone to attain more information on patient.

## 2019-10-06 NOTE — ED Notes (Signed)
Psych with pt att 

## 2019-10-06 NOTE — BHH Counselor (Signed)
Patient under Review at:  [facility]  [facility]  [facility]  [facility]   [facility]  [facility]  [facility]  [facility]  [facility]  [facility]  [facility]  [facility]  [facility]  [facility]  [facility]  [facility]  [facility]  [facility]

## 2019-10-06 NOTE — ED Notes (Addendum)
Pt reports "I'm here because of a disagreement with my father. 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  Pt reports responsibilities to his daughter and saw his therapist 2 weeks ago and Celexa 20 mg was added to daily Wellbutrin 150 mg, pt reports hx of dx of depression but reports ADHD as problem  Pt denies access to firearm, NO SI/HI/AH/VH   Pt denies  Drug and ETOH use

## 2019-10-06 NOTE — ED Notes (Signed)
Pt to interview room to tele TTS, Joni Reining

## 2019-10-06 NOTE — BH Assessment (Signed)
Assessment Note  Benjamin Crane. is an 34 y.o. male.with past medical history of polysubstance abuse and depression who presents to the ED for psychiatric evaluation.  Patient was initially evaluated at [facility] and Per IVC paperwork, he had expressed thoughts of wanting to end his life and at one point held a loaded gun to his head. Patient reports that the allegations and it's involuntary commitment forms are not accurate. He reports that he never had access to a gun and never placed a gun to his head. He explains that he feels like this is a way for his parents to get rid of him as he has been to this situation with them in the past. Pt. denies any suicidal ideation, plan or intent. Pt. denies the presence of any auditory or visual hallucinations at this time. Patient denies any other medical complaints. He reports for previous mental health history and states that he currently goes to [facility] for depression and takes his Wellbutrin and Celexa as prescribed. 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 When he presented this issue to his father they had an altercation and he made the statement "what are you doing trying to push me to commit suicide." Patient states is it was just a figure of speech. He reports an upcoming appointment with a psychiatrist at [facility] on Monday that explains that he has plans on attending.   Diagnosis: Major Depression   Past Medical History:  Past Medical History:  Diagnosis Date  . ADHD   . Anxiety   . Cocaine abuse (HCC)   . Depression   . GERD (gastroesophageal reflux disease)     Past Surgical History:  Procedure Laterality Date  . TONSILLECTOMY      Family History: No family history on file.  Social History:  reports that he has been smoking e-cigarettes and cigarettes. He has been smoking about 0.25 packs per day. He has never used smokeless tobacco. He reports previous drug use. Drug: Cocaine.  He reports that he does not drink alcohol.  Additional Social History:  Alcohol / Drug Use Pain Medications: See PTA Prescriptions: See PTA Over the Counter: See PTA History of alcohol / drug use?: Yes Substance #1 Name of Substance 1: HX of meth use sober 1.5 year  CIWA: CIWA-Ar BP: 111/79 Pulse Rate: 79 COWS:    Allergies: No Known Allergies  Home Medications: (Not in a hospital admission)   OB/GYN Status:  No LMP for male patient.  General Assessment Data Location of Assessment: [facility] ED TTS Assessment: In system Is this a Tele or Face-to-Face Assessment?: Tele Assessment Is this an Initial Assessment or a Re-assessment for this encounter?: Initial Assessment Patient Accompanied by:: N/A Language Other than English: No Living Arrangements: Homeless/Shelter What gender do you identify as?: Male Living Arrangements: Other (Comment) Can pt return to current living arrangement?: Yes Admission Status: Involuntary Petitioner: Family member Is patient capable of signing voluntary admission?: No Referral Source: Self/Family/Friend Insurance type: None   Medical Screening Exam [facility] Walk-in ONLY) Medical Exam completed: Yes  Crisis Care Plan Living Arrangements: Other (Comment) Legal Guardian: Other:(None) Name of Psychiatrist: [facility] Name of Therapist: [employer]   Education Status Is patient currently in school?: No Is the patient employed, unemployed or receiving disability?: Employed  Risk to self with the past 6 months Suicidal Ideation: No Has patient been a risk to self within the past 6 months prior to admission? : No  Suicidal Intent: No Has patient had any suicidal intent within the past 6 months prior to admission? : No Is patient at risk for suicide?: No, but patient needs Medical Clearance Suicidal Plan?: No Has patient had any suicidal plan within the past 6 months prior to admission? : No Access to Means: No What has been your use of  drugs/alcohol within the last 12 months?: Hx of use  Previous Attempts/Gestures: No How many times?: 0 Other Self Harm Risks: none  Intentional Self Injurious Behavior: None Family Suicide History: No Recent stressful life event(s): Conflict (Comment), Loss (Comment) Persecutory voices/beliefs?: No Depression: No Depression Symptoms: Guilt Substance abuse history and/or treatment for substance abuse?: Yes Suicide prevention information given to non-admitted patients: Not applicable  Risk to Others within the past 6 months Homicidal Ideation: No Does patient have any lifetime risk of violence toward others beyond the six months prior to admission? : No Thoughts of Harm to Others: No Current Homicidal Intent: No Current Homicidal Plan: No Access to Homicidal Means: No History of harm to others?: No Assessment of Violence: None Noted Does patient have access to weapons?: No Criminal Charges Pending?: No Does patient have a court date: No Is patient on probation?: No  Psychosis Hallucinations: None noted Delusions: None noted  Mental Status Report Appearance/Hygiene: In scrubs Eye Contact: Poor Motor Activity: Freedom of movement Speech: Logical/coherent Level of Consciousness: Alert Mood: Anxious Affect: Anxious Anxiety Level: Moderate Thought Processes: Relevant Judgement: Unimpaired  Cognitive Functioning Concentration: Good Memory: Recent Intact, Remote Intact Is patient IDD: No Insight: Fair Impulse Control: Good Appetite: Fair Have you had any weight changes? : No Change Sleep: No Change Total Hours of Sleep: 6 Vegetative Symptoms: None  ADLScreening [facility] Assessment Services) Patient's cognitive ability adequate to safely complete daily activities?: Yes Patient able to express need for assistance with ADLs?: Yes Independently performs ADLs?: Yes (appropriate for developmental age)  Prior Inpatient Therapy Prior Inpatient Therapy: No  Prior Outpatient  Therapy Prior Outpatient Therapy: Yes Prior Therapy Dates: current;y, one month  Prior Therapy Facilty/Provider(s): [facility] Reason for Treatment: Depression  Does patient have an ACCT team?: No Does patient have Intensive In-House Services?  : No Does patient have [facility] services? : No Does patient have P4CC services?: No  ADL Screening (condition at time of admission) Patient's cognitive ability adequate to safely complete daily activities?: Yes Patient able to express need for assistance with ADLs?: Yes Independently performs ADLs?: Yes (appropriate for developmental age)       Abuse/Neglect Assessment (Assessment to be complete while patient is alone) Abuse/Neglect Assessment Can Be Completed: Yes Physical Abuse: Denies Verbal Abuse: Denies Sexual Abuse: Denies Exploitation of patient/patient's resources: Denies Self-Neglect: Denies Values / Beliefs Cultural Requests During Hospitalization: None Spiritual Requests During Hospitalization: None Consults Spiritual Care Consult Needed: No Transition of Care Team Consult Needed: No Advance Directives (For Healthcare) Does Patient Have a Medical Advance Directive?: No          Disposition:  Disposition Initial Assessment Completed for this Encounter: Yes Mode of transportation if patient is discharged/movement?: N/A Patient referred to: Other (Comment)(Consult with Psych NP)  On Site Evaluation by:   Reviewed with Physician:    Laretta Alstrom 10/06/2019 1:00 AM

## 2019-10-06 NOTE — ED Notes (Signed)
Pt given meal tray.

## 2019-10-07 NOTE — ED Notes (Signed)
Pt received breakfast tray.   Pt calm and relaxing in his room.  lw edt

## 2019-10-07 NOTE — ED Notes (Signed)
Attempted to call [facility] for report, no answer at this time.

## 2019-10-07 NOTE — ED Notes (Addendum)
No EMTALA signature due to pt is IVC.  Pt aware of transfer to [facility], pt informed of transport and responds "I'm not fucking going anywhere."

## 2019-10-07 NOTE — BH Assessment (Signed)
PATIENT BED AVAILABLE AFTER 9AM on 10/07/19  Patient has been accepted to [facility].  Patient assigned to [facility] C-Unit Accepting physician is Dr. Lorella Nimrod.  Call report to [phone].  Representative was [place].   ER Staff is aware of it:  [facility] ER Secretary  Dr. York Cerise, ER MD  Dewayne Hatch Patient's Nurse     Address:  [address] [place] [address] Patient must check in at the [facility] for COVID screening  If patient cannot be transported please call [facility] to inform them when patient will be transported at [phone]

## 2019-10-07 NOTE — ED Notes (Signed)
C-Com called for transport to [facility]

## 2019-10-07 NOTE — ED Notes (Signed)
This note is not being shared with the patient for the following reason:  emtala reviewed by this rn

## 2019-10-07 NOTE — ED Provider Notes (Signed)
-----------------------------------------   5:57 AM on 10/07/2019 -----------------------------------------   Blood pressure 103/66, pulse 76, temperature 98.4 F (36.9 C), temperature source Oral, resp. rate 16, height 1.778 m (5\' 10" ), weight 72.6 kg, SpO2 97 %.  The patient is calm and cooperative at this time.  The patient is to be transferred to [facility] psychiatric facility later today once transportation can be arranged.  EMTALA documentation (other than section for) is complete.   [facility], MD 10/07/19 [phone]

## 2020-01-22 ENCOUNTER — Telehealth: Payer: Self-pay | Admitting: Family Medicine

## 2020-01-22 NOTE — Telephone Encounter (Signed)
Individual has been contacted 3+ times regarding ED referral and has been given information regarding how to become a pt. No further attempts to contact individual will be made. 

## 2020-09-04 ENCOUNTER — Telehealth: Payer: Self-pay

## 2020-09-04 NOTE — Telephone Encounter (Signed)
ED had referred patient to Dr Lacie Scotts last June but they were unable to get in contact with the patient and stopped attempts to reach him.    Copied from CRM [id number]. Topic: Appointment Scheduling - New Patient >> Sep 04, 2020  3:31 PM Daphine Deutscher D wrote: New patient has been scheduled for your office.  No..  Provider: Dr. Sherrie Mustache  .Dad sees Dr. Eustaquio Boyden Sr.  DOB  02-05-1957 Dad wants to know if Dr. Sherrie Mustache will see his son.  He is having some psychological problems,  seeing things,  hearing voices ect  CB#  [phone]  Route to department's PEC pool.

## 2020-09-05 NOTE — Telephone Encounter (Signed)
Patients dad Benjamin Crane advised and verbalized understanding. Benjamin Crane says he is going to call back to schedule an appointment for his son to establish care with Dr. Sherrie Mustache. Benjamin Crane is going to also go ahead and call one of the recommended psychiatrist to see if they will see his son as a new patient.

## 2020-09-05 NOTE — Telephone Encounter (Signed)
I can accept him a new patient, but if he is hearing voices he needs to see a psychiatrist. I would recommend Dr. Janeece Riggers or Dr Maryruth Bun here in [place], or Dr. Jennelle Human in [place].

## 2020-09-21 ENCOUNTER — Emergency Department
Admission: EM | Admit: 2020-09-21 | Discharge: 2020-09-22 | Disposition: A | Discharge status: Home / Self Care | Payer: 59 | Attending: Emergency Medicine | Admitting: Emergency Medicine

## 2020-09-21 ENCOUNTER — Other Ambulatory Visit: Payer: Self-pay

## 2020-09-21 DIAGNOSIS — F1510 Other stimulant abuse, uncomplicated: Secondary | ICD-10-CM | POA: Diagnosis present

## 2020-09-21 DIAGNOSIS — F15950 Other stimulant use, unspecified with stimulant-induced psychotic disorder with delusions: Secondary | ICD-10-CM | POA: Diagnosis present

## 2020-09-21 DIAGNOSIS — F15151 Other stimulant abuse with stimulant-induced psychotic disorder with hallucinations: Secondary | ICD-10-CM | POA: Insufficient documentation

## 2020-09-21 DIAGNOSIS — R456 Violent behavior: Secondary | ICD-10-CM | POA: Insufficient documentation

## 2020-09-21 DIAGNOSIS — F17210 Nicotine dependence, cigarettes, uncomplicated: Secondary | ICD-10-CM | POA: Insufficient documentation

## 2020-09-21 DIAGNOSIS — F99 Mental disorder, not otherwise specified: Secondary | ICD-10-CM

## 2020-09-21 LAB — CBC WITH DIFFERENTIAL/PLATELET
Abs Immature Granulocytes: 0.02 10*3/uL (ref 0.00–0.07)
Basophils Absolute: 0 10*3/uL (ref 0.0–0.1)
Basophils Relative: 1 %
Eosinophils Absolute: 0.1 10*3/uL (ref 0.0–0.5)
Eosinophils Relative: 1 %
HCT: 45.9 % (ref 39.0–52.0)
Hemoglobin: 15.5 g/dL (ref 13.0–17.0)
Immature Granulocytes: 0 %
Lymphocytes Relative: 24 %
Lymphs Abs: 2.1 10*3/uL (ref 0.7–4.0)
MCH: 28.7 pg (ref 26.0–34.0)
MCHC: 33.8 g/dL (ref 30.0–36.0)
MCV: 85 fL (ref 80.0–100.0)
Monocytes Absolute: 0.7 10*3/uL (ref 0.1–1.0)
Monocytes Relative: 8 %
Neutro Abs: 5.7 10*3/uL (ref 1.7–7.7)
Neutrophils Relative %: 66 %
Platelets: 246 10*3/uL (ref 150–400)
RBC: 5.4 MIL/uL (ref 4.22–5.81)
RDW: 12.9 % (ref 11.5–15.5)
WBC: 8.6 10*3/uL (ref 4.0–10.5)
nRBC: 0 % (ref 0.0–0.2)

## 2020-09-21 LAB — COMPREHENSIVE METABOLIC PANEL
ALT: 24 U/L (ref 0–44)
AST: 19 U/L (ref 15–41)
Albumin: 4.9 g/dL (ref 3.5–5.0)
Alkaline Phosphatase: 91 U/L (ref 38–126)
Anion gap: 7 (ref 5–15)
BUN: 13 mg/dL (ref 6–20)
CO2: 26 mmol/L (ref 22–32)
Calcium: 9.8 mg/dL (ref 8.9–10.3)
Chloride: 107 mmol/L (ref 98–111)
Creatinine, Ser: 1.07 mg/dL (ref 0.61–1.24)
GFR, Estimated: >60 mL/min (ref >60)
Glucose, Bld: 102 mg/dL — ABNORMAL HIGH (ref 70–99)
Potassium: 4.2 mmol/L (ref 3.5–5.1)
Sodium: 140 mmol/L (ref 135–145)
Total Bilirubin: 0.7 mg/dL (ref 0.3–1.2)
Total Protein: 8 g/dL (ref 6.5–8.1)

## 2020-09-21 LAB — URINE DRUG SCREEN, QUALITATIVE (ARMC ONLY)
Amphetamines, Ur Screen: POSITIVE — AB
Barbiturates, Ur Screen: NOT DETECTED
Benzodiazepine, Ur Scrn: NOT DETECTED
Cannabinoid 50 Ng, Ur ~~LOC~~: NOT DETECTED
Cocaine Metabolite,Ur ~~LOC~~: NOT DETECTED
MDMA (Ecstasy)Ur Screen: NOT DETECTED
Methadone Scn, Ur: NOT DETECTED
Opiate, Ur Screen: NOT DETECTED
Phencyclidine (PCP) Ur S: NOT DETECTED
Tricyclic, Ur Screen: NOT DETECTED

## 2020-09-21 LAB — ETHANOL: Alcohol, Ethyl (B): <10 mg/dL (ref <10)
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
## 2020-09-21 NOTE — ED Provider Notes (Signed)
[facility] Emergency Department Provider Note   ____________________________________________   I have reviewed the triage vital signs and the nursing notes.   HISTORY  Chief Complaint IVC   History limited by: Not Limited   HPI Benjamin Crane. is a 35 y.o. male who presents to the emergency department today under IVC because of concerns for medication noncompliance and aggressive behavior.  Patient himself states that he is not sure why he was IVC.  He does admit to not taking his medication for the past 2 months however he denies any thoughts of self-harm.  He states he has been under some stress and does get angry at times but does not feel like he has ever been aggressive.  Patient denies any medical complaints.   Records reviewed. Per medical record review patient has a history of psychiatric illness in the past for suicidal ideation.   Past Medical History:  Diagnosis Date  . ADHD   . Anxiety   . Cocaine abuse (HCC)   . Depression   . GERD (gastroesophageal reflux disease)     Patient Active Problem List   Diagnosis Date Noted  . Psychosis (HCC) 10/06/2019  . Suicidal ideation   . 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 Cocaine abuse (HCC) 09/23/2016    Past Surgical History:  Procedure Laterality Date  . TONSILLECTOMY      Prior to Admission medications   Medication Sig Start Date End Date Taking? Authorizing Provider  busPIRone (BUSPAR) 15 MG tablet Take 15 mg by mouth 2 (two) times daily.    [provider]  escitalopram (LEXAPRO) 20 MG tablet Take 20 mg by mouth daily.    [provider]  ibuprofen (ADVIL) 600 MG tablet Take 1 tablet (600 mg total) by mouth every 8 (eight) hours as needed. 04/12/19   Joni Reining, PA-C  pantoprazole (PROTONIX) 40 MG tablet Take 40 mg by mouth daily.    [provider]  traMADol  (ULTRAM) 50 MG tablet Take 1 tablet (50 mg total) by mouth every 6 (six) hours as needed for moderate pain. 04/12/19   Joni Reining, PA-C    Allergies Patient has no known allergies.  No family history on file.  Social History Social History   Tobacco Use  . Smoking status: Current Every Day Smoker    Packs/day: 0.25    Types: E-cigarettes, Cigarettes  . Smokeless tobacco: Never Used  Vaping Use  . Vaping Use: Some days  Substance Use Topics  . Alcohol use: No  . Drug use: Not Currently    Types: Cocaine    Review of Systems Constitutional: No fever/chills Eyes: No visual changes. ENT: No sore throat. Cardiovascular: Denies chest pain. Respiratory: Denies shortness of breath. Gastrointestinal: No abdominal pain.  No nausea, no vomiting.  No diarrhea.   Genitourinary: Negative for dysuria. Musculoskeletal: Negative for back pain. Skin: Negative for rash. Neurological: Negative for headaches, focal weakness or numbness.  ____________________________________________   PHYSICAL EXAM:  VITAL SIGNS: ED Triage Vitals  Enc Vitals Group     BP 09/21/20 1521 132/87     Pulse Rate 09/21/20 1521 75     Resp 09/21/20 1521 17     Temp 09/21/20 1521 97.9 F (36.6 C)     Temp Source 09/21/20 1521 Oral     SpO2 09/21/20 1521 99 %     Weight 09/21/20 1522 160 lb (72.6 kg)  Height 09/21/20 1522 5\' 10"  (1.778 m)     Head Circumference --      Peak Flow --      Pain Score 09/21/20 1531 0   Constitutional: Alert and oriented.  Eyes: Conjunctivae are normal.  ENT      Head: Normocephalic and atraumatic.      Nose: No congestion/rhinnorhea.      Mouth/Throat: Mucous membranes are moist.      Neck: No stridor. Hematological/Lymphatic/Immunilogical: No cervical lymphadenopathy. Cardiovascular: Normal rate, regular rhythm.  No murmurs, rubs, or gallops.  Respiratory: Normal respiratory effort without tachypnea nor retractions. Breath sounds are clear and equal bilaterally.  No wheezes/rales/rhonchi. Gastrointestinal: Soft and non tender. No rebound. No guarding.  Genitourinary: Deferred Musculoskeletal: Normal range of motion in all extremities. No lower extremity edema. Neurologic:  Normal speech and language. No gross focal neurologic deficits are appreciated.  Skin:  Skin is warm, dry and intact. No rash noted. Psychiatric: Mood and affect are normal. Calm. Denies SI  ____________________________________________    LABS (pertinent positives/negatives)  Ethanol <10 UDS positive for amphetamines  CMP wnl except glu 102 CBC wbc 8.6, hgb 15.5, plt 246  ____________________________________________   EKG  None  ____________________________________________    RADIOLOGY  None  ____________________________________________   PROCEDURES  Procedures  ____________________________________________   INITIAL IMPRESSION / ASSESSMENT AND PLAN / ED COURSE  Pertinent labs & imaging results that were available during my care of the patient were reviewed by me and considered in my medical decision making (see chart for details).   Patient presents to the emergency department today because of concerns for abnormal behavior in the setting of not taking his medication.  On exam patient appears fairly calm.  He does admit however to not taking his medication.  This time will plan on having psychiatry evaluate.  The patient has been placed in psychiatric observation due to the need to provide a safe environment for the patient while obtaining psychiatric consultation and evaluation, as well as ongoing medical and medication management to treat the patient's condition.  The patient has been placed under full IVC at this time.  ____________________________________________   FINAL CLINICAL IMPRESSION(S) / ED DIAGNOSES  Final diagnoses:  Psychiatric complaint     Note: This dictation was prepared with Dragon dictation. Any transcriptional errors that  result from this process are unintentional     09/23/20, MD 09/21/20 [id number]

## 2020-09-21 NOTE — ED Notes (Signed)
Pt is making a phone call with this RNs permission. Phone is on speaker phone and officer is in room.  Pt belongings include: 1 brown wallet 1 cell phone.  4, 1$ bills 1 grey pair shoes 1 pair black socks 1 grey and white hat.  1 grey jacket 1 blue shirt 1 red/black pants

## 2020-09-21 NOTE — ED Triage Notes (Signed)
Pt states he is here because "My father is a piece of shit." Pt states that everything in his IVC paperwork is a lie and that he does not have schizophrenia. Pt states he is mentally okay and that he has not been taking his medications. Pt denies SI and HI.  Pt is calm and cooperative in triage. Officers state that the pt has been calm and cooperative in triage.

## 2020-09-21 NOTE — ED Notes (Signed)
Pt. Was given a sandwich tray and a drink. 

## 2020-09-22 ENCOUNTER — Telehealth: Payer: Self-pay

## 2020-09-22 DIAGNOSIS — F15950 Other stimulant use, unspecified with stimulant-induced psychotic disorder with delusions: Secondary | ICD-10-CM

## 2020-09-22 MED ORDER — ESCITALOPRAM OXALATE 10 MG PO TABS
20.0000 mg | ORAL_TABLET | Freq: Every day | ORAL | Status: DC
  Administered 2020-09-22: 20 mg via ORAL
  Filled 2020-09-22: qty 2

## 2020-09-22 MED ORDER — PANTOPRAZOLE SODIUM 40 MG PO TBEC
40.0000 mg | DELAYED_RELEASE_TABLET | Freq: Every day | ORAL | Status: DC
  Administered 2020-09-22: 40 mg via ORAL
  Filled 2020-09-22: qty 1

## 2020-09-22 MED ORDER — BUSPIRONE HCL 10 MG PO TABS
15.0000 mg | ORAL_TABLET | Freq: Two times a day (BID) | ORAL | Status: DC
  Administered 2020-09-22: 15 mg via ORAL
  Filled 2020-09-22: qty 3

## 2020-09-22 NOTE — Telephone Encounter (Signed)
He can establish here and we can refer him to psychiatrist if he needs a referral to see one. But it sounds like he may have schizophrenia or something similar, which is not something I treat.

## 2020-09-22 NOTE — Consult Note (Signed)
[facility]   Reason for Consult: Consult for 35 year old man with a history of amphetamine and drug abuse brought in with reports of psychosis Referring Physician: Katrinka BlazingSmith Patient Identification: Benjamin MassonLawrence L Gordin Jr. MRN:  [id number] Principal Diagnosis: Amphetamine and psychostimulant-induced psychosis with delusions (HCC) Diagnosis:  Principal Problem:   Amphetamine and psychostimulant-induced psychosis with delusions (HCC) Active Problems:   Amphetamine abuse (HCC)   Total Time spent with patient: 1 hour  Subjective:   Benjamin MassonLawrence L Pates Jr. is a 35 y.o. male patient admitted with "it is all lies".  HPI: History reviewed.  Patient seen.  Commitment papers by father report agitated psychotic behaviors aggression and suicidal statements.  Patient denies all of this.  Says he was minding his own business in his room when somebody broke in with the police.  Patient denies drug abuse.  When confronted with his drug screen does admit to amphetamine abuse.  Patient is denying suicidal ideation does not appear to be responding to internal stimuli.  Poor insight but not aggressive not threatening to anyone.  Past Psychiatric History: History of previous episodes of this with substance abuse as primary problem  Risk to Self:   Risk to Others:   Prior Inpatient Therapy:   Prior Outpatient Therapy:    Past Medical History:  Past Medical History:  Diagnosis Date  . ADHD   . Anxiety   . Cocaine abuse (HCC)   . Depression   . GERD (gastroesophageal reflux disease)     Past Surgical History:  Procedure Laterality Date  . TONSILLECTOMY     Family History: No family history on file. Family Psychiatric  History: See previous Social History:  Social History   Substance and Sexual Activity  Alcohol Use No     Social History   Substance and Sexual Activity  Drug Use Not Currently  . Types: Cocaine    Social History   Socioeconomic History  . Marital  status: Married    Spouse name: Not on file  . Number of children: Not on file  . Years of education: Not on file  . Highest education level: Not on file  Occupational History  . Not on file  Tobacco Use  . Smoking status: Current Every Day Smoker    Packs/day: 0.25    Types: E-cigarettes, Cigarettes  . Smokeless tobacco: Never Used  Vaping Use  . Vaping Use: Some days  Substance and Sexual Activity  . Alcohol use: No  . Drug use: Not Currently    Types: Cocaine  . Sexual activity: Not on file  Other Topics Concern  . Not on file  Social History Narrative  . Not on file   Social Determinants of Health   Financial Resource Strain: Not on file  Food Insecurity: Not on file  Transportation Needs: Not on file  Physical Activity: Not on file  Stress: Not on file  Social Connections: Not on file   Additional Social History:    Allergies:  No Known Allergies  Labs:  Results for orders placed or performed during the hospital encounter of 09/21/20 (from the past 48 hour(s))  Comprehensive metabolic panel     Status: Abnormal   Collection Time: 09/21/20  3:26 PM  Result Value Ref Range   Sodium 140 135 - 145 mmol/L   Potassium 4.2 3.5 - 5.1 mmol/L   Chloride 107 98 - 111 mmol/L   CO2 26 22 - 32 mmol/L   Glucose, Bld 102 (H) 70 - 99  mg/dL    Comment: Glucose reference range applies only to samples taken after fasting for at least 8 hours.   BUN 13 6 - 20 mg/dL   Creatinine, Ser 5.42 0.61 - 1.24 mg/dL   Calcium 9.8 8.9 - 70.6 mg/dL   Total Protein 8.0 6.5 - 8.1 g/dL   Albumin 4.9 3.5 - 5.0 g/dL   AST 19 15 - 41 U/L   ALT 24 0 - 44 U/L   Alkaline Phosphatase 91 38 - 126 U/L   Total Bilirubin 0.7 0.3 - 1.2 mg/dL   GFR, Estimated >23 >76 mL/min    Comment: (NOTE) Calculated using the CKD-EPI Creatinine Equation (2021)    Anion gap 7 5 - 15    Comment: Performed at [facility], [address] [place], [place] [address]  Ethanol     Status: None    Collection Time: 09/21/20  3:26 PM  Result Value Ref Range   Alcohol, Ethyl (B) <10 <10 mg/dL    Comment: (NOTE) Lowest detectable limit for serum alcohol is 10 mg/dL.  For medical purposes only. Performed at [facility], [address] [place], [place] [address]   Urine Drug Screen, Qualitative     Status: Abnormal   Collection Time: 09/21/20  3:26 PM  Result Value Ref Range   Tricyclic, Ur Screen NONE DETECTED NONE DETECTED   Amphetamines, Ur Screen POSITIVE (A) NONE DETECTED   MDMA (Ecstasy)Ur Screen NONE DETECTED NONE DETECTED   Cocaine Metabolite,Ur [place] NONE DETECTED NONE DETECTED   Opiate, Ur Screen NONE DETECTED NONE DETECTED   Phencyclidine (PCP) Ur S NONE DETECTED NONE DETECTED   Cannabinoid 50 Ng, Ur [place] NONE DETECTED NONE DETECTED   Barbiturates, Ur Screen NONE DETECTED NONE DETECTED   Benzodiazepine, Ur Scrn NONE DETECTED NONE DETECTED   Methadone Scn, Ur NONE DETECTED NONE DETECTED    Comment: (NOTE) Tricyclics + metabolites, urine    Cutoff 1000 ng/mL Amphetamines + metabolites, urine  Cutoff 1000 ng/mL MDMA (Ecstasy), urine              Cutoff 500 ng/mL Cocaine Metabolite, urine          Cutoff 300 ng/mL Opiate + metabolites, urine        Cutoff 300 ng/mL Phencyclidine (PCP), urine         Cutoff 25 ng/mL Cannabinoid, urine                 Cutoff 50 ng/mL Barbiturates + metabolites, urine  Cutoff 200 ng/mL Benzodiazepine, urine              Cutoff 200 ng/mL Methadone, urine                   Cutoff 300 ng/mL  The urine drug screen provides only a preliminary, unconfirmed analytical test result and should not be used for non-medical purposes. Clinical consideration and professional judgment should be applied to any positive drug screen result due to possible interfering substances. A more specific alternate chemical method must be used in order to obtain a confirmed analytical result. Gas chromatography / mass spectrometry (GC/MS) is the  preferred confirm atory method. Performed at [facility], [address] [place], [place] [address]   CBC with Diff     Status: None   Collection Time: 09/21/20  3:26 PM  Result Value Ref Range   WBC 8.6 4.0 - 10.5 K/uL   RBC 5.40 4.22 - 5.81 MIL/uL   Hemoglobin 15.5  13.0 - 17.0 g/dL   HCT 83.2 91.9 - 16.6 %   MCV 85.0 80.0 - 100.0 fL   MCH 28.7 26.0 - 34.0 pg   MCHC 33.8 30.0 - 36.0 g/dL   RDW 06.0 04.5 - 99.7 %   Platelets 246 150 - 400 K/uL   nRBC 0.0 0.0 - 0.2 %   Neutrophils Relative % 66 %   Neutro Abs 5.7 1.7 - 7.7 K/uL   Lymphocytes Relative 24 %   Lymphs Abs 2.1 0.7 - 4.0 K/uL   Monocytes Relative 8 %   Monocytes Absolute 0.7 0.1 - 1.0 K/uL   Eosinophils Relative 1 %   Eosinophils Absolute 0.1 0.0 - 0.5 K/uL   Basophils Relative 1 %   Basophils Absolute 0.0 0.0 - 0.1 K/uL   Immature Granulocytes 0 %   Abs Immature Granulocytes 0.02 0.00 - 0.07 K/uL    Comment: Performed at [facility], [address], [place], [place] [address]    Current Facility-Administered Medications  Medication Dose Route Frequency Provider Last Rate Last Admin  . busPIRone (BUSPAR) tablet 15 mg  15 mg Oral BID Ward, Kristen N, DO   15 mg at 09/22/20 0954  . escitalopram (LEXAPRO) tablet 20 mg  20 mg Oral Daily Ward, Kristen N, DO   20 mg at 09/22/20 0954  . pantoprazole (PROTONIX) EC tablet 40 mg  40 mg Oral Daily Ward, Kristen N, DO   40 mg at 09/22/20 3953   Current Outpatient Medications  Medication Sig Dispense Refill  . busPIRone (BUSPAR) 15 MG tablet Take 15 mg by mouth 2 (two) times daily.    Marland Kitchen escitalopram (LEXAPRO) 20 MG tablet Take 20 mg by mouth daily.    Marland Kitchen ibuprofen (ADVIL) 600 MG tablet Take 1 tablet (600 mg total) by mouth every 8 (eight) hours as needed. 15 tablet 0  . pantoprazole (PROTONIX) 40 MG tablet Take 40 mg by mouth daily.    . traMADol (ULTRAM) 50 MG tablet Take 1 tablet (50 mg total) by mouth every 6 (six) hours as needed for moderate pain.  12 tablet 0    Musculoskeletal: Strength & Muscle Tone: decreased Gait & Station: normal Patient leans: N/A  Psychiatric Specialty Exam: Physical Exam Vitals and nursing note reviewed.  Constitutional:      Appearance: He is well-developed and well-nourished.  HENT:     Head: Normocephalic and atraumatic.  Eyes:     Conjunctiva/sclera: Conjunctivae normal.     Pupils: Pupils are equal, round, and reactive to light.  Cardiovascular:     Heart sounds: Normal heart sounds.  Pulmonary:     Effort: Pulmonary effort is normal.  Abdominal:     Palpations: Abdomen is soft.  Musculoskeletal:        General: Normal range of motion.     Cervical back: Normal range of motion.  Skin:    General: Skin is warm and dry.  Neurological:     General: No focal deficit present.     Mental Status: He is alert.  Psychiatric:        Attention and Perception: He is inattentive.        Mood and Affect: Mood is anxious.        Speech: Speech is rapid and pressured.        Behavior: Behavior is agitated. Behavior is not aggressive.        Thought Content: Thought content is paranoid. Thought content does not include homicidal or suicidal  ideation.        Cognition and Memory: Memory is impaired.        Judgment: Judgment is impulsive.     Review of Systems  Constitutional: Negative.   HENT: Negative.   Eyes: Negative.   Respiratory: Negative.   Cardiovascular: Negative.   Gastrointestinal: Negative.   Musculoskeletal: Negative.   Skin: Negative.   Neurological: Negative.   Psychiatric/Behavioral: Negative.     Blood pressure 128/84, pulse (!) 101, temperature 98.1 F (36.7 C), temperature source Oral, resp. rate 16, height 5\' 10"  (1.778 m), weight 72.6 kg, SpO2 98 %.Body mass index is 22.96 kg/m.  General Appearance: Disheveled  Eye Contact:  Fair  Speech:  Clear and Coherent  Volume:  Increased  Mood:  Anxious  Affect:  Congruent  Thought Process:  Goal Directed  Orientation:   Full (Time, Place, and Person)  Thought Content:  Logical  Suicidal Thoughts:  No  Homicidal Thoughts:  No  Memory:  Immediate;   Fair Recent;   Poor  Judgement:  Impaired  Insight:  Shallow  Psychomotor Activity:  Decreased  Concentration:  Concentration: Poor  Recall:  of Knowledge:  Fair  Language:  Fair  Akathisia:  No  Handed:  Right  AIMS (if indicated):     Assets:  Desire for Improvement  ADL's:  Impaired  Cognition:  Impaired,  Mild  Sleep:        Treatment Plan Summary: Plan Patient currently not showing signs of acute dangerousness.  Appears to be having ongoing problems related to drug abuse.  Supportive counseling and suggestion that he consider getting into treatment.  His insight is poor but at this point he no longer meets commitment criteria.  Case reviewed with emergency room physician and TTS.  Discontinue IVC.  Recommend to him that he follow-up with outpatient mental health treatment for substance abuse.  Disposition: No evidence of imminent risk to self or others at present.   Patient does not meet criteria for psychiatric inpatient admission. Supportive therapy provided about ongoing stressors.  [employer], MD 09/22/2020 7:04 PM

## 2020-09-22 NOTE — ED Notes (Signed)
IVC, pend placement, moved to [facility]

## 2020-09-22 NOTE — Telephone Encounter (Signed)
Copied from CRM [id number]. Topic: Appointment Scheduling - Prior Auth Required for Appointment >> Aug 18, 2020  3:29 PM Gaetana Michaelis A wrote: No appointment has been scheduled. Patient's father is requesting an appointment with Dr. Sherrie Mustache.  Per scheduling protocol, this appointment requires a prior authorization prior to scheduling. Patient's father is a patient of Dr. Sherrie Mustache, Patient's father would like him seen to discuss mental health  Route to department's PEC pool.

## 2020-09-22 NOTE — BH Assessment (Signed)
Comprehensive Clinical Assessment (CCA) Note  09/22/2020 Benjamin Crane [id number]  Chief Complaint: Patient is a 35 year old male presenting to [facility] ED under IVC. Per triage note Pt states he is here because "My father is a piece of shit." Pt states that everything in his IVC paperwork is a lie and that he does not have schizophrenia. Pt states he is mentally okay and that he has not been taking his medications. Pt denies SI and HI. Pt is calm and cooperative in triage. Officers state that the pt has been calm and cooperative in triage. During assessment patient appears alert and oriented x4, calm and cooperative. Patient reports "my father called and said all that stuff." Patient denies being diagnosed with Schizophrenia. Patient also denies any Methamphetamines, patient reports that he has a history of using but reports that he has not used anything "in a couple of days." Patient UDS is positive for Amphetamines. Patient reports having a therapist "he comes to my house he's a family friend." Patient reports that he hasn't had his medications in 2 1/2 months. Patient denies SI/HI/AH/VH and does not appear to be responding to any internal or external stimuli.  Per Psyc NP Lerry Liner patient to be reassessed  Chief Complaint  Patient presents with  . IVC   Visit Diagnosis: Amphetamine abuse, Amphetamine induced psychosis by hx   CCA Screening, Triage and Referral (STR)  Patient Reported Information How did you hear about [place]? Other (Comment)  Referral name: No data recorded Referral phone number: No data recorded  Whom do you see for routine medical problems? Other (Comment)  Practice/Facility Name: No data recorded Practice/Facility Phone Number: No data recorded Name of Contact: No data recorded Contact Number: No data recorded Contact Fax Number: No data recorded Prescriber Name: No data recorded Prescriber Address (if known): No data recorded  What Is the Reason for  Your Visit/Call Today? No data recorded How Long Has This Been Causing You Problems? > than 6 months  What Do You Feel Would Help You the Most Today? Assessment Only   Have You Recently Been in Any Inpatient Treatment (Hospital/Detox/Crisis Center/28-Day Program)? No  Name/Location of Program/Hospital:No data recorded How Long Were You There? No data recorded When Were You Discharged? No data recorded  Have You Ever Received Services From [facility] Before? No  Who Do You See at [facility]? No data recorded  Have You Recently Had Any Thoughts About Hurting Yourself? No  Are You Planning to Commit Suicide/Harm Yourself At This time? No   Have you Recently Had Thoughts About Hurting Someone Karolee Ohs? No  Explanation: No data recorded  Have You Used Any Alcohol or Drugs in the Past 24 Hours? No  How Long Ago Did You Use Drugs or Alcohol? No data recorded What Did You Use and How Much? No data recorded  Do You Currently Have a Therapist/Psychiatrist? No  Name of Therapist/Psychiatrist: No data recorded  Have You Been Recently Discharged From Any Office Practice or Programs? No  Explanation of Discharge From Practice/Program: No data recorded    CCA Screening Triage Referral Assessment Type of Contact: Face-to-Face  Is this Initial or Reassessment? No data recorded Date Telepsych consult ordered in [facility]:  No data recorded Time Telepsych consult ordered in [facility]:  No data recorded  Patient Reported Information Reviewed? Yes  Patient Left Without Being Seen? No data recorded Reason for Not Completing Assessment: No data recorded  Collateral Involvement: No data recorded  Does Patient  Have a Automotive engineer Guardian? No data recorded Name and Contact of Legal Guardian: none   If Minor and Not Living with Parent(s), Who has Custody? No data recorded Is CPS involved or ever been involved? Never  Is APS involved or ever been involved? Never   Patient Determined To  Be At Risk for Harm To Self or Others Based on Review of Patient Reported Information or Presenting Complaint? No  Method: No data recorded Availability of Means: No data recorded Intent: No data recorded Notification Required: No data recorded Additional Information for Danger to Others Potential: No data recorded Additional Comments for Danger to Others Potential: No data recorded Are There Guns or Other Weapons in Your Home? No data recorded Types of Guns/Weapons: No data recorded Are These Weapons Safely Secured?                            No data recorded Who Could Verify You Are Able To Have These Secured: No data recorded Do You Have any Outstanding Charges, Pending Court Dates, Parole/Probation? No data recorded Contacted To Inform of Risk of Harm To Self or Others: No data recorded  Location of Assessment: [facility] ED   Does Patient Present under Involuntary Commitment? Yes  IVC Papers Initial File Date: 09/21/2020   [place] of Residence: [place]   Patient Currently Receiving the Following Services: No data recorded  Determination of Need: No data recorded  Options For Referral: No data recorded    CCA Biopsychosocial Intake/Chief Complaint:  Patient presenting under IVC  Current Symptoms/Problems: Patient presenting under IVC   Patient Reported Schizophrenia/Schizoaffective Diagnosis in Past: No   Strengths: None reported  Preferences: None reported  Abilities: None reported   Type of Services Patient Feels are Needed: None reported   Initial Clinical Notes/Concerns: None   Mental Health Symptoms Depression:  None   Duration of Depressive symptoms: No data recorded  Mania:  None   Anxiety:   None   Psychosis:  None   Duration of Psychotic symptoms: No data recorded  Trauma:  None   Obsessions:  None   Compulsions:  None   Inattention:  None   Hyperactivity/Impulsivity:  N/A   Oppositional/Defiant Behaviors:  None   Emotional  Irregularity:  None   Other Mood/Personality Symptoms:  No data recorded   Mental Status Exam Appearance and self-care  Stature:  Average   Weight:  Average weight   Clothing:  Casual   Grooming:  Normal   Cosmetic use:  None   Posture/gait:  Normal   Motor activity:  Not Remarkable   Sensorium  Attention:  Normal   Concentration:  Normal   Orientation:  X5   Recall/memory:  Normal   Affect and Mood  Affect:  Appropriate   Mood:  Other (Comment)   Relating  Eye contact:  Normal   Facial expression:  Responsive   Attitude toward examiner:  Cooperative   Thought and Language  Speech flow: Clear and Coherent   Thought content:  Appropriate to Mood and Circumstances   Preoccupation:  None   Hallucinations:  None   Organization:  No data recorded  [employer] of Knowledge:  Fair   Intelligence:  Average   Abstraction:  Normal   Judgement:  Fair   Dance movement psychotherapist:  Adequate   Insight:  Fair   Decision Making:  Normal   Social Functioning  Social Maturity:  Responsible   Social  Judgement:  Normal   Stress  Stressors:  Other (Comment)   Coping Ability:  Normal   Skill Deficits:  None   Supports:  Support needed     Religion: Religion/Spirituality Are You A Religious Person?: No  Leisure/Recreation: Leisure / Recreation Do You Have Hobbies?: No  Exercise/Diet: Exercise/Diet Do You Exercise?: No Have You Gained or Lost A Significant Amount of Weight in the Past Six Months?: No Do You Follow a Special Diet?: No Do You Have Any Trouble Sleeping?: No   CCA Employment/Education Employment/Work Situation: Employment / Work Situation Employment situation: Unemployed What is the longest time patient has a held a job?: Unknown Where was the patient employed at that time?: Unknown Has patient ever been in the [employer]?: No  Education: Education Is Patient Currently Attending School?: No Did Garment/textile technologist From [employer]?:  (Unknown) Did You Product manager?: No Did Designer, television/film set?: No Did You Have An Individualized Education Program (IIEP): No Did You Have Any Difficulty At [employer]?: No Patient's Education Has Been Impacted by Current Illness: No   CCA Family/Childhood History Family and Relationship History: Family history Marital status: Single Are you sexually active?:  (Unknown) What is your sexual orientation?: Unknown Has your sexual activity been affected by drugs, alcohol, medication, or emotional stress?: Unknown Does patient have children?:  (Unknown)  Childhood History:  Childhood History Additional childhood history information: None reported Description of patient's relationship with caregiver when they were a child: None reported Patient's description of current relationship with people who raised him/her: None reported How were you disciplined when you got in trouble as a child/adolescent?: None reported Does patient have siblings?:  (None reported) Did patient suffer any verbal/emotional/physical/sexual abuse as a child?: No Did patient suffer from severe childhood neglect?: No Has patient ever been sexually abused/assaulted/raped as an adolescent or adult?: No Was the patient ever a victim of a crime or a disaster?: No Witnessed domestic violence?: No Has patient been affected by domestic violence as an adult?: No  Child/Adolescent Assessment:     CCA Substance Use Alcohol/Drug Use: Alcohol / Drug Use Pain Medications: See MAR Prescriptions: See MAR Over the Counter: See MAR History of alcohol / drug use?: Yes Substance #1 Name of Substance 1: Amphetamines                       ASAM's:  Six Dimensions of Multidimensional Assessment  Dimension 1:  Acute Intoxication and/or Withdrawal Potential:      Dimension 2:  Biomedical Conditions and Complications:      Dimension 3:  Emotional, Behavioral, or Cognitive Conditions and Complications:      Dimension 4:  Readiness to Change:     Dimension 5:  Relapse, Continued use, or Continued Problem Potential:     Dimension 6:  Recovery/Living Environment:     ASAM Severity Score:    ASAM Recommended Level of Treatment:     Substance use Disorder (SUD)    Recommendations for Services/Supports/Treatments:   Per Psyc NP Rashaun Dixon patient to be reassessed   DSM5 Diagnoses: Patient Active Problem List   Diagnosis Date Noted  . Psychosis (HCC) 10/06/2019  . Suicidal ideation   . 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 Cocaine abuse (HCC) 09/23/2016    Patient Centered Plan: Patient is on the following Treatment Plan(s):  Substance Abuse   Referrals to Alternative Service(s): Referred to Alternative Service(s):  Place:   Date:   Time:    Referred to Alternative Service(s):   Place:   Date:   Time:    Referred to Alternative Service(s):   Place:   Date:   Time:    Referred to Alternative Service(s):   Place:   Date:   Time:     Jeralyn Nolden A Avannah Decker, LCAS-A

## 2020-09-22 NOTE — ED Notes (Signed)
Patient moved to [facility] 

## 2020-09-22 NOTE — BH Assessment (Signed)
TTS completed reassessment. Pt presents alert, oriented x 5, anxious but cooperative. Pt reports to feel a little better than yesterday since getting some rest. Pt reports to be unsure of what happened yesterday resulting to his admission. Pt states, "eveyrthing my dad said is a lie". Pt did not admit or deny the use of Amphetamines but denies any ETOH use. Pt states, " I know the ETOH is a lie because I do not even drink alcohol". Pt reports a MH hx of depression but denies a hx of anxiety. Pt reports compliance with only Celexa and Wellbutrin. Pt reports to endorse a lack of appetite and struggles sleeping for the last month due to life stressors. Pt currently refuse to discuss or identify his stressors and expressed no interest in INPT, OPT or SA resources. Pt denies any current SI/HI/AH/VH and does not appear to be responding to any internal or external stimuli. Pt contracted for safety and currently asking to be discharged.

## 2020-09-22 NOTE — ED Provider Notes (Signed)
Emergency Medicine Observation Re-evaluation Note  Benjamin Crane. is a 35 y.o. male, seen on rounds today.  Pt initially presented to the ED for complaints of IVC Currently, the patient is resting comfortably.  Physical Exam  BP 106/62 (BP Location: Left Arm)   Pulse 78   Temp 98.2 F (36.8 C) (Oral)   Resp 18   Ht 5\' 10"  (1.778 m)   Wt 72.6 kg   SpO2 99%   BMI 22.96 kg/m  Physical Exam Gen: No acute distress  Resp: Normal rise and fall of chest Neuro: Moving all four extremities Psych: Resting currently, calm and cooperative when awake    ED Course / MDM  EKG:    I have reviewed the labs performed to date as well as medications administered while in observation.  Recent changes in the last 24 hours include no acute events overnight.  Plan  Current plan is for psychiatric evaluation for further disposition. Patient is under full IVC at this time.   Ward, , DO 09/22/20 [phone]

## 2020-09-22 NOTE — ED Notes (Signed)
Pt discharged home. VS stable. All belongings returned to patient. Discharge instructions reviewed with patient.  

## 2020-09-22 NOTE — Telephone Encounter (Signed)
Dr. Sherrie Mustache please advise if you will see patient for mental health issues. Per phone message from 09/04/2020 you agreed to accept him as a new patient but advised that he needs to see psychiatry for mental health.

## 2020-09-22 NOTE — ED Provider Notes (Signed)
Patient cleared for discharge by Dr. Toni Amend.  Discharged in stable condition.    Gilles Chiquito, MD 09/22/20 [phone]

## 2020-09-23 NOTE — Telephone Encounter (Signed)
New patient appointment scheduled for 10/10/2020 at 9am. Tried calling patient. Left message to call back. This is the soonest time we have available.

## 2020-09-24 NOTE — Telephone Encounter (Signed)
Tried calling patients dad Mr. Cayabyab Sr. Left message to call back.

## 2020-09-26 NOTE — Telephone Encounter (Signed)
NP paperwork sent to patient.

## 2020-09-26 NOTE — Telephone Encounter (Signed)
I tried calling patient's dad. No answer. I left message to call back.   I called the number listed in patient's chart and spoke with patient. I advised him of this new patient appointment. He accepted appointment. I informed patient that Dr. Sherrie Mustache would not be managing his mental health, and that the patient would need to see psychiatry for that. Patient verbalized understanding and agreed.  Can someone mail patient his new patient paperwork. The address on file is correct. Thanks.   [address]  [place] [address]

## 2020-09-29 ENCOUNTER — Telehealth: Payer: Self-pay

## 2020-09-29 NOTE — Telephone Encounter (Signed)
Pt's father called to verify the date of pt's appt day and time. I gave pt's father these details.

## 2020-10-10 ENCOUNTER — Other Ambulatory Visit: Payer: Self-pay

## 2020-10-10 ENCOUNTER — Encounter: Payer: Self-pay | Admitting: Family Medicine

## 2020-10-10 ENCOUNTER — Ambulatory Visit (INDEPENDENT_AMBULATORY_CARE_PROVIDER_SITE_OTHER): Payer: 59 | Admitting: Family Medicine

## 2020-10-10 VITALS — BP 109/94 | HR 98 | Temp 98.1°F | Ht 71.0 in | Wt 152.2 lb

## 2020-10-10 DIAGNOSIS — F1510 Other stimulant abuse, uncomplicated: Secondary | ICD-10-CM | POA: Diagnosis not present

## 2020-10-10 DIAGNOSIS — F419 Anxiety disorder, unspecified: Secondary | ICD-10-CM

## 2020-10-10 DIAGNOSIS — F32A Depression, unspecified: Secondary | ICD-10-CM

## 2020-10-10 DIAGNOSIS — F1410 Cocaine abuse, uncomplicated: Secondary | ICD-10-CM

## 2020-10-10 DIAGNOSIS — F29 Unspecified psychosis not due to a substance or known physiological condition: Secondary | ICD-10-CM | POA: Diagnosis not present

## 2020-10-10 DIAGNOSIS — K219 Gastro-esophageal reflux disease without esophagitis: Secondary | ICD-10-CM

## 2020-10-10 MED ORDER — ARIPIPRAZOLE 5 MG PO TABS
5.0000 mg | ORAL_TABLET | Freq: Every day | ORAL | 1 refills | Status: DC
Start: 2020-10-10 — End: 2021-04-03

## 2020-10-10 MED ORDER — PANTOPRAZOLE SODIUM 40 MG PO TBEC: 40.0000 mg | DELAYED_RELEASE_TABLET | Freq: Every day | ORAL | 4 refills | Status: DC | PRN

## 2020-10-10 MED ORDER — CITALOPRAM HYDROBROMIDE 20 MG PO TABS: 20.0000 mg | ORAL_TABLET | Freq: Every day | ORAL | 3 refills | Status: DC

## 2020-10-10 MED ORDER — BUPROPION HCL 75 MG PO TABS: 75.0000 mg | ORAL_TABLET | Freq: Every day | ORAL | 3 refills | Status: DC

## 2020-10-10 NOTE — Progress Notes (Signed)
New patient visit   Patient: Benjamin Crane.   DOB: 09-13-1985   34 y.o. Male  MRN: [id number] Visit Date: 10/10/2020  Today's healthcare provider: Mila Merry, MD   Chief Complaint  Patient presents with  . Medication Refill  . Establish Care   Subjective    Benjamin Crane. is a 35 y.o. male who presents today as a new patient to establish care.  HPI  Establishing care and getting medications refilled. Pt would like to discuss stress management. Has been worse since his mother passed away last year and his brother in 05/2019.   He is a previous patient at [facility]. He has a long history of anxiety, depression and substance abuse, particularly stimulants including cocaine and methamphetamines. He is here with his father today and reports that he has not been using any illicit substances for a long period of times.  He had ER visit in March of 2021 for suicidal ideation and apparently was hearing voices at that time. He was apparently admitted to [facility] and stared on [address]. He has since been followed by [facility] who had him citalopram, Abilify and bupropion. However he has not had follow up with psychiatrist for several months and has not been taking his medications. He states hearing voices was only when he was under extreme amount of stress, but was also abusing stimulants during that episodes. He thinks the Abilify was to help with his mood.   He had ER visit last month for mood changes related to not taking psychiatric medications. His drug screen was positive for amphetamines at that visit. He was discharged with prescriptions for escitalopram and buspirone.    He also states he has been seeing therapist at [facility].  Past Medical History:  Diagnosis Date  . ADHD   . Anxiety   . Cocaine abuse (HCC)   . Depression   . GERD (gastroesophageal reflux disease)    Past Surgical History:  Procedure  Laterality Date  . TONSILLECTOMY     No family status information on file.   No family history on file. Social History   Socioeconomic History  . Marital status: Married    Spouse name: Not on file  . Number of children: Not on file  . Years of education: Not on file  . Highest education level: Not on file  Occupational History  . Not on file  Tobacco Use  . Smoking status: Current Every Day Smoker    Packs/day: 0.25    Types: E-cigarettes, Cigarettes  . Smokeless tobacco: Never Used  Vaping Use  . Vaping Use: Some days  Substance and Sexual Activity  . Alcohol use: No  . Drug use: Not Currently    Types: Cocaine  . Sexual activity: Not on file  Other Topics Concern  . Not on file  Social History Narrative  . Not on file   Social Determinants of Health   Financial Resource Strain: Not on file  Food Insecurity: Not on file  Transportation Needs: Not on file  Physical Activity: Not on file  Stress: Not on file  Social Connections: Not on file   Outpatient Medications Prior to Visit  Medication Sig  . ARIPiprazole (ABILIFY) 5 MG tablet Take 5 mg by mouth daily.  Marland Kitchen buPROPion (WELLBUTRIN) 100 MG tablet Take 100 mg by mouth daily. Pt is taking 50 MG  . busPIRone (BUSPAR) 15 MG tablet Take 15 mg by mouth  2 (two) times daily.  . pantoprazole (PROTONIX) 40 MG tablet Take 40 mg by mouth daily.  Marland Kitchen escitalopram (LEXAPRO) 20 MG tablet Take 20 mg by mouth daily. (Patient not taking: Reported on 10/10/2020)  . ibuprofen (ADVIL) 600 MG tablet Take 1 tablet (600 mg total) by mouth every 8 (eight) hours as needed. (Patient not taking: Reported on 10/10/2020)  . traMADol (ULTRAM) 50 MG tablet Take 1 tablet (50 mg total) by mouth every 6 (six) hours as needed for moderate pain. (Patient not taking: Reported on 10/10/2020)   No facility-administered medications prior to visit.   Allergies  Allergen Reactions  . Lactose Intolerance (Gi)     Immunization History  Administered  Date(s) Administered  . Tdap 03/27/2018    Health Maintenance  Topic Date Due  . Hepatitis C Screening  Never done  . COVID-19 Vaccine (1) Never done  . HIV Screening  Never done  . INFLUENZA VACCINE  Never done  . TETANUS/TDAP  03/27/2028  . HPV VACCINES  Aged Out    Patient Care Team: Malva Limes, MD as PCP - General (Family Medicine)  Review of Systems  Constitutional: Positive for activity change and appetite change.  HENT: Positive for dental problem.   Psychiatric/Behavioral: Positive for agitation, decreased concentration and dysphoric mood. The patient is nervous/anxious and is hyperactive.   All other systems reviewed and are negative.      Objective    BP (!) 109/94 (BP Location: Right Arm, Patient Position: Sitting, Cuff Size: Normal)   Pulse 98   Temp 98.1 F (36.7 C) (Temporal)   Ht 5\' 11"  (1.803 m)   Wt 152 lb 3.2 oz (69 kg)   SpO2 100%   BMI 21.23 kg/m  Physical Exam  General appearance: Well developed, well nourished male, cooperative and in no acute distress Head: Normocephalic, without obvious abnormality, atraumatic Respiratory: Respirations even and unlabored, normal respiratory rate Extremities: All extremities are intact.  Skin: Skin color, texture, turgor normal. No rashes seen  Psych: Appropriate mood and affect. Neurologic: Mental status: Alert, oriented to person, place, and time, thought content appropriate.   Depression Screen PHQ 2/9 Scores 10/10/2020  PHQ - 2 Score 4  PHQ- 9 Score 12     Assessment & Plan      1. Psychosis, unspecified psychosis type (HCC) History suggests this is likely secondary to underlying mood disorders or stimulant abuse rather than a primary psychosis. He was on ability prescribe by Dr. 10/12/2020 before running out several months, and he is not inclined to return to psychiatrist. Will send for old records and start back on - ARIPiprazole (ABILIFY) 5 MG tablet; Take 1 tablet (5 mg total) by mouth daily.   Dispense: 30 tablet; Refill: 1  2. Anxiety   3. Depression, unspecified depression type Previously on combination of - ARIPiprazole (ABILIFY) 5 MG tablet; Take 1 tablet (5 mg total) by mouth daily.  Dispense: 30 tablet; Refill: 1 - citalopram (CELEXA) 20 MG tablet; Take 1 tablet (20 mg total) by mouth daily.  Dispense: 30 tablet; Refill: 3 - buPROPion (WELLBUTRIN) 75 MG tablet; Take 1 tablet (75 mg total) by mouth daily.  Dispense: 30 tablet; Refill: 3  4. Amphetamine abuse (HCC) He states he has been off of all stimulants for several months. However he does report history of ADD and states he feels like he might need to be treated for this again at some point. Rx records indicated Dr. Mervyn Skeeters did treat him with 80mg  atomoxetine  at one point.   5. Cocaine abuse (HCC)   6. Gastroesophageal reflux disease, unspecified whether esophagitis present He reports remote GI workup including upper endoscopy. He does take OTC famotidine regularly but did better with prn - pantoprazole (PROTONIX) 40 MG tablet; Take 1 tablet (40 mg total) by mouth daily as needed.  Dispense: 30 tablet; Refill: 4   Future Appointments  Date Time Provider Department Center  11/10/2020 11:20 AM Sherrie Mustache, Demetrios Isaacs, MD [facility]        The entirety of the information documented in the History of Present Illness, Review of Systems and Physical Exam were personally obtained by me. Portions of this information were initially documented by the CMA and reviewed by me for thoroughness and accuracy.      Mila Merry, MD  [facility] [phone] (phone) [phone] (fax)  [facility]

## 2020-10-13 ENCOUNTER — Encounter: Payer: Self-pay | Admitting: Family Medicine

## 2020-11-10 ENCOUNTER — Ambulatory Visit: Payer: Self-pay | Admitting: Family Medicine

## 2020-11-10 NOTE — Progress Notes (Deleted)
      Established patient visit   Patient: Benjamin Crane.   DOB: 1985/12/17   35 y.o. Male  MRN: [id number] Visit Date: 11/10/2020  Today's healthcare provider: Mila Merry, MD   No chief complaint on file.  Subjective    HPI  Anxiety, Follow-up  He was last seen for anxiety 1 months ago. Changes made at last visit include none.   He reports {excellent/good/fair/poor:19665} compliance with treatment. He reports {good/fair/poor:18685} tolerance of treatment. He {is/is not:21021397} having side effects. {document side effects if present:1}  He feels his anxiety is {Desc; severity:60313} and {improved/worse/unchanged:3041574} since last visit.  GAD-7 Results No flowsheet data found.  PHQ-9 Scores PHQ9 SCORE ONLY 10/10/2020  PHQ-9 Total Score 12    --------------------------------------------------------------------------------------------------- Depression, Follow-up  He  was last seen for this 1 months ago. Changes made at last visit include  ARIPiprazole (ABILIFY) 5 MG tablet; Take 1 tablet (5 mg total) by mouth daily, citalopram (CELEXA) 20 MG tablet; Take 1 tablet (20 mg total) by mouth daily, buPROPion (WELLBUTRIN) 75 MG tablet; Take 1 tablet (75 mg total) by mouth daily.   He reports {excellent/good/fair/poor:19665} compliance with treatment. He {is/is not:21021397} having side effects. ***  He reports {DESC; GOOD/FAIR/POOR:18685} tolerance of treatment. Current symptoms include: {Symptoms; depression:1002} He feels he is {improved/worse/unchanged:3041574} since last visit.  Depression screen PHQ 2/9 10/10/2020  Decreased Interest 2  Down, Depressed, Hopeless 2  PHQ - 2 Score 4  Altered sleeping 1  Tired, decreased energy 1  Change in appetite 1  Feeling bad or failure about yourself  2  Trouble concentrating 1  Moving slowly or fidgety/restless 2  Suicidal thoughts 0  PHQ-9 Score 12  Difficult doing work/chores Very difficult     -----------------------------------------------------------------------------------------   {Show patient history (optional):23778::" "}   Medications: Outpatient Medications Prior to Visit  Medication Sig  . ARIPiprazole (ABILIFY) 5 MG tablet Take 1 tablet (5 mg total) by mouth daily.  Marland Kitchen buPROPion (WELLBUTRIN) 75 MG tablet Take 1 tablet (75 mg total) by mouth daily.  . citalopram (CELEXA) 20 MG tablet Take 1 tablet (20 mg total) by mouth daily.  Marland Kitchen ibuprofen (ADVIL) 600 MG tablet Take 1 tablet (600 mg total) by mouth every 8 (eight) hours as needed. (Patient not taking: Reported on 10/10/2020)  . pantoprazole (PROTONIX) 40 MG tablet Take 1 tablet (40 mg total) by mouth daily as needed.  . traMADol (ULTRAM) 50 MG tablet Take 1 tablet (50 mg total) by mouth every 6 (six) hours as needed for moderate pain. (Patient not taking: Reported on 10/10/2020)   No facility-administered medications prior to visit.    Review of Systems  {Labs  Heme  Chem  Endocrine  Serology  Results Review (optional):23779::" "}   Objective    There were no vitals taken for this visit. {Show previous vital signs (optional):23777::" "}   Physical Exam  ***  No results found for any visits on 11/10/20.  Assessment & Plan     ***  No follow-ups on file.      {provider attestation***:1}   Mila Merry, MD  [facility] [phone] (phone) [phone] (fax)  [facility]

## 2020-11-18 ENCOUNTER — Other Ambulatory Visit: Payer: Self-pay

## 2020-11-24 NOTE — Progress Notes (Signed)
Established patient visit   Patient: Benjamin Crane.   DOB: 08-29-1985   35 y.o. Male  MRN: [id number] Visit Date: 11/25/2020  Today's healthcare provider: Mila Merry, MD   Chief Complaint  Patient presents with  . Depression  . Anxiety  . Gastroesophageal Reflux   Subjective    HPI  Depression, Psychosis and Anxiety, Follow-up  He  was last seen for this on 10/10/2020.   Changes made at last visit include start back on - ARIPiprazole (ABILIFY) 5 MG tablet daily   He reports good compliance with treatment. He is not having side effects.   He reports good tolerance of treatment. Current symptoms include: depressed mood He feels he is Improved since last visit.  Depression screen [facility] 2/9 11/25/2020 10/10/2020  Decreased Interest 2 2  Down, Depressed, Hopeless 1 2  PHQ - 2 Score 3 4  Altered sleeping 2 1  Tired, decreased energy 2 1  Change in appetite 0 1  Feeling bad or failure about yourself  1 2  Trouble concentrating 0 1  Moving slowly or fidgety/restless 2 2  Suicidal thoughts 0 0  PHQ-9 Score 10 12  Difficult doing work/chores Very difficult Very difficult    He and his father report that he has not had any episodes of hallucinations since getting back on medications. However he is sleeping excessively. He states he sleeps well through the night, but still sleeps several hours during the day. He denies any recent use of street drugs. 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 He does still see his therapist at least once a week. He does have a little anxiety, but feels like depression is more of a problem.  -----------------------------------------------------------------------------------------  Follow up for GERD:  The patient was last seen for this on 10/10/2020.   Changes made at last visit include starting as needed Pantoprazole 40mg  daily.  He reports good compliance with  treatment. He feels that condition is Improved. He is not having side effects.   -----------------------------------------------------------------------------------------      Medications: Outpatient Medications Prior to Visit  Medication Sig  . ARIPiprazole (ABILIFY) 5 MG tablet Take 1 tablet (5 mg total) by mouth daily.  buPROPion (WELLBUTRIN) 75 MG tablet Take 1 tablet (75 mg total) by mouth daily.  Marland Kitchen ibuprofen (ADVIL) 600 MG tablet Take 1 tablet (600 mg total) by mouth every 8 (eight) hours as needed.  . pantoprazole (PROTONIX) 40 MG tablet Take 1 tablet (40 mg total) by mouth daily as needed.  . traMADol (ULTRAM) 50 MG tablet Take 1 tablet (50 mg total) by mouth every 6 (six) hours as needed for moderate pain.  . [DISCONTINUED] citalopram (CELEXA) 20 MG tablet Take 1 tablet (20 mg total) by mouth daily.   No facility-administered medications prior to visit.    Review of Systems  Constitutional: Negative for appetite change, chills and fever.  Respiratory: Negative for chest tightness, shortness of breath and wheezing.   Cardiovascular: Negative for chest pain and palpitations.  Gastrointestinal: Negative for abdominal pain, nausea and vomiting.  Psychiatric/Behavioral: Positive for dysphoric mood.       Objective    BP 108/72 (BP Location: Left Arm, Patient Position: Sitting, Cuff Size: Normal)   Pulse (!) 102   Temp 98.1 F (36.7 C) (Temporal)   Resp 16   Wt 157 lb 6.4 oz (71.4 kg)   BMI  21.95 kg/m     Physical Exam  General appearance: Well developed, well nourished male, cooperative and in no acute distress Psych: Appropriate mood and affect, fidgety with persistent tremors of his legs.  Neurologic: Mental status: Alert, oriented to person, place, and time, thought content appropriate.     Assessment & Plan     1. Depression, unspecified depression type Slight improvement since starting back on Abilify and citalopram. Will increase from 20mg  a day to   citalopram (CELEXA) 20 MG tablet; Take 2 tablets (40 mg total) by mouth daily.; Refill: 0  If doing well will send in prescription for 40mg  tablets before the 20s run out.   2. Anxiety increase- citalopram (CELEXA) 20 MG tablet; Take 2 tablets (40 mg total) by mouth daily.; Refill: 0  3. Psychosis, unspecified psychosis type (HCC) By patient and his fathers report this has resolved since starting back on Abilify.             , MD  [facility] [phone] (phone) [phone] (fax)  [facility]

## 2020-11-25 ENCOUNTER — Encounter: Payer: Self-pay | Admitting: Family Medicine

## 2020-11-25 ENCOUNTER — Ambulatory Visit (INDEPENDENT_AMBULATORY_CARE_PROVIDER_SITE_OTHER): Payer: 59 | Admitting: Family Medicine

## 2020-11-25 ENCOUNTER — Other Ambulatory Visit: Payer: Self-pay

## 2020-11-25 VITALS — BP 108/72 | HR 102 | Temp 98.1°F | Resp 16 | Wt 157.4 lb

## 2020-11-25 DIAGNOSIS — F29 Unspecified psychosis not due to a substance or known physiological condition: Secondary | ICD-10-CM

## 2020-11-25 DIAGNOSIS — F419 Anxiety disorder, unspecified: Secondary | ICD-10-CM | POA: Diagnosis not present

## 2020-11-25 DIAGNOSIS — F32A Depression, unspecified: Secondary | ICD-10-CM | POA: Diagnosis not present

## 2020-11-25 MED ORDER — CITALOPRAM HYDROBROMIDE 20 MG PO TABS: 40.0000 mg | ORAL_TABLET | Freq: Every day | ORAL | 0 refills | Status: DC

## 2020-11-25 NOTE — Patient Instructions (Signed)
.   Go ahead and increase citalopram to 2 20mg  tablets every morning. We'll be in touch in a few weeks and change the next prescription to 40mg  if doing better.

## 2020-12-07 ENCOUNTER — Other Ambulatory Visit: Payer: Self-pay | Admitting: Family Medicine

## 2020-12-07 DIAGNOSIS — F419 Anxiety disorder, unspecified: Secondary | ICD-10-CM

## 2020-12-07 DIAGNOSIS — F32A Depression, unspecified: Secondary | ICD-10-CM

## 2020-12-16 MED ORDER — CITALOPRAM HYDROBROMIDE 40 MG PO TABS: 40.0000 mg | ORAL_TABLET | Freq: Every day | ORAL | 3 refills | Status: DC

## 2021-01-17 DIAGNOSIS — L0291 Cutaneous abscess, unspecified: Secondary | ICD-10-CM | POA: Insufficient documentation

## 2021-04-02 ENCOUNTER — Telehealth: Payer: Self-pay

## 2021-04-02 DIAGNOSIS — K219 Gastro-esophageal reflux disease without esophagitis: Secondary | ICD-10-CM

## 2021-04-02 DIAGNOSIS — F32A Depression, unspecified: Secondary | ICD-10-CM

## 2021-04-02 DIAGNOSIS — F29 Unspecified psychosis not due to a substance or known physiological condition: Secondary | ICD-10-CM
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
## 2021-04-03 MED ORDER — PANTOPRAZOLE SODIUM 40 MG PO TBEC: 40.0000 mg | DELAYED_RELEASE_TABLET | Freq: Every day | ORAL | 1 refills | Status: DC | PRN

## 2021-04-03 MED ORDER — CITALOPRAM HYDROBROMIDE 40 MG PO TABS: 40.0000 mg | ORAL_TABLET | Freq: Every day | ORAL | 1 refills | Status: DC

## 2021-04-03 MED ORDER — BUPROPION HCL 75 MG PO TABS
75.0000 mg | ORAL_TABLET | Freq: Every day | ORAL | 1 refills | Status: DC
Start: 2021-04-03 — End: 2023-02-28

## 2021-04-03 MED ORDER — ARIPIPRAZOLE 5 MG PO TABS: 5.0000 mg | ORAL_TABLET | Freq: Every day | ORAL | 1 refills | Status: DC

## 2021-04-03 NOTE — Telephone Encounter (Signed)
Prescriptions sent to [facility]  

## 2021-04-03 NOTE — Addendum Note (Signed)
Addended by: Mila Merry E on: 04/03/2021 11:05 AM   Modules accepted: Orders

## 2021-05-12 ENCOUNTER — Telehealth: Payer: Self-pay

## 2021-05-12 DIAGNOSIS — F419 Anxiety disorder, unspecified: Secondary | ICD-10-CM

## 2021-05-12 DIAGNOSIS — F32A Depression, unspecified: Secondary | ICD-10-CM

## 2021-05-12 DIAGNOSIS — F29 Unspecified psychosis not due to a substance or known physiological condition: Secondary | ICD-10-CM

## 2021-05-12 NOTE — Addendum Note (Signed)
Addended by: Malva Limes on: 05/12/2021 03:20 PM   Modules accepted: Orders

## 2021-05-12 NOTE — Telephone Encounter (Signed)
Tayler advised.  (Father)  He would like to go forward with the referral however he in not on pt's DPR.  Please advised.  Thanks,   -Vernona Rieger

## 2021-05-12 NOTE — Telephone Encounter (Signed)
No, that is way beyond my scope of practice. That must be prescribed by a psychiatrist or mental health specialist. We can place a referral for him .

## 2021-05-12 NOTE — Telephone Encounter (Signed)
Patient's father is wanting to know if you would prescribe Paliperidone for his issues.   Father has someone who can give him the injection.    He received these at [facility] rehab but the cost was astronomical. He is not taking Ability as of now because he is incarcerated.  Father is going to get him out soon and wanted to know if you would prescribe this. If you need to see him, he would be willing to bring him in for appt.   Please let father know.   [phone] (father Mavrik)

## 2021-07-10 ENCOUNTER — Ambulatory Visit (INDEPENDENT_AMBULATORY_CARE_PROVIDER_SITE_OTHER): Payer: 59 | Admitting: Family Medicine

## 2021-07-10 ENCOUNTER — Other Ambulatory Visit: Payer: Self-pay

## 2021-07-10 ENCOUNTER — Encounter: Payer: Self-pay | Admitting: Family Medicine

## 2021-07-10 VITALS — BP 115/74 | HR 86 | Temp 97.9°F | Wt 166.0 lb

## 2021-07-10 DIAGNOSIS — F32A Depression, unspecified: Secondary | ICD-10-CM

## 2021-07-10 DIAGNOSIS — S22001D Stable burst fracture of unspecified thoracic vertebra, subsequent encounter for fracture with routine healing: Secondary | ICD-10-CM

## 2021-07-10 DIAGNOSIS — F419 Anxiety disorder, unspecified: Secondary | ICD-10-CM
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
## 2021-07-16 ENCOUNTER — Telehealth: Payer: Self-pay | Admitting: Family Medicine

## 2021-07-16 NOTE — Telephone Encounter (Signed)
Can you let the patient know that referral to psychiatry was sent in October, but their office wasn't able to reach him by phone. He can call [facility] at  [phone] to schedule an appointment.

## 2021-07-21 NOTE — Telephone Encounter (Signed)
Patient was advised.  

## 2021-08-13 ENCOUNTER — Ambulatory Visit: Payer: Self-pay

## 2021-08-13 NOTE — Telephone Encounter (Signed)
Pt's father called, was provided with referral information from Dr. Sherrie Mustache: He can call [facility] at  [phone] to schedule an appointment.   Pt's father was going to go ahead and call [facility] to schedule an appt and if too far out call office back to schedule an appt with one of the providers.   Summary: Appt for referral for psy   Pt's father called asking for an appt for his son with Dr. Sherrie Mustache.  He states he stays in bed all day and father thought Dr. Sherrie Mustache had done a referral to psychiatrist for him but I did not see anything.  The first appt for Dr. Sherrie Mustache is in April.   CB#  [phone]

## 2021-09-02 ENCOUNTER — Ambulatory Visit: Payer: Self-pay | Admitting: Family Medicine

## 2021-09-02 ENCOUNTER — Other Ambulatory Visit: Payer: Self-pay

## 2021-09-02 ENCOUNTER — Encounter: Payer: Self-pay | Admitting: Family Medicine

## 2021-09-02 ENCOUNTER — Ambulatory Visit (INDEPENDENT_AMBULATORY_CARE_PROVIDER_SITE_OTHER): Payer: 59 | Admitting: Family Medicine

## 2021-09-02 VITALS — BP 124/91 | HR 91 | Temp 97.7°F | Resp 12 | Wt 162.0 lb

## 2021-09-02 DIAGNOSIS — S2232XD Fracture of one rib, left side, subsequent encounter for fracture with routine healing: Secondary | ICD-10-CM | POA: Diagnosis not present

## 2021-09-02 DIAGNOSIS — S22001D Stable burst fracture of unspecified thoracic vertebra, subsequent encounter for fracture with routine healing: Secondary | ICD-10-CM

## 2021-09-02 NOTE — Patient Instructions (Signed)
Please review the attached list of medications and notify my office if there are any errors.   Go to the [facility] on [facility] for left rib and spine Xray
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
## 2021-09-09 ENCOUNTER — Emergency Department
Admission: EM | Admit: 2021-09-09 | Discharge: 2021-09-09 | Disposition: A | Discharge status: Home / Self Care | Payer: 59 | Attending: Student in an Organized Health Care Education/Training Program | Admitting: Student in an Organized Health Care Education/Training Program

## 2021-09-09 ENCOUNTER — Other Ambulatory Visit: Payer: Self-pay

## 2021-09-09 DIAGNOSIS — K047 Periapical abscess without sinus: Secondary | ICD-10-CM

## 2021-09-09 DIAGNOSIS — K0889 Other specified disorders of teeth and supporting structures: Secondary | ICD-10-CM | POA: Diagnosis not present

## 2021-09-09 MED ORDER — AMOXICILLIN 875 MG PO TABS: 875.0000 mg | ORAL_TABLET | Freq: Two times a day (BID) | ORAL | 0 refills | Status: DC

## 2021-09-09 NOTE — ED Triage Notes (Signed)
Pt states he has a broken tooth and over the past couple of days having pain and some swelling

## 2021-09-09 NOTE — Discharge Instructions (Signed)
Begin taking antibiotics and finish completely.  Avoid hot or cold foods which may aggravate your dental pain.  A list of dental clinics is listed on your discharge papers including the clinic at [facility].  Take Tylenol or ibuprofen as needed for pain although it is the infection that is causing your pain presently.  OPTIONS FOR DENTAL FOLLOW UP CARE  [place] [facility] of Health and Human Services - Local Safety Net Dental Clinics [email or web address]   [facility] [phone])  [facility] [phone])  [place] [phone])  [facility] [phone])  [facility] [phone]) This clinic caters to the indigent population and is on a lottery system. Location: [employer] [facility], [employer], [address], [place] Clinic Hours: Wednesdays from 6pm - 9pm, patients seen by a lottery system. For dates, call or go to [email or web address] Services: Cleanings, fillings and simple extractions. Payment Options: DENTAL WORK IS FREE OF CHARGE. Bring proof of income or support. Best way to get seen: Arrive at 5:15 pm - this is a lottery, NOT first come/first serve, so arriving earlier will not increase your chances of being seen.     [facility] [phone] Select option 1 for emergencies   Location: [facility], [place], [address] [place] Clinic Hours: No walk-ins accepted - call the day before to schedule an appointment. Check in times are 9:30 am and 1:30 pm. Services: Simple extractions, temporary fillings, pulpectomy/pulp debridement, uncomplicated abscess drainage. Payment Options: PAYMENT IS DUE AT THE TIME OF SERVICE.  Fee is usually $100-200, additional surgical procedures (e.g. abscess drainage) may be extra. Cash, checks, Visa/MasterCard accepted.  Can file  Medicaid if patient is covered for dental - patient should call case worker to check. No discount for [facility] patients. Best way to get seen: MUST call the day before and get onto the schedule. Can usually be seen the next 1-2 days. No walk-ins accepted.     [facility] [phone]   Location: [facility], [address], [place] Clinic Hours: M, W, Th, F 8am or 1:30pm, Tues 9a or 1:30 - first come/first served. Services: Simple extractions, temporary fillings, uncomplicated abscess drainage.  You do not need to be an [facility] resident. Payment Options: PAYMENT IS DUE AT THE TIME OF SERVICE. Dental insurance, otherwise sliding scale - bring proof of income or support. Depending on income and treatment needed, cost is usually $50-200. Best way to get seen: Arrive early as it is first come/first served.     [facility] [phone]   Location: [address] Clinic Hours: Mon-Thu 8a-5p Services: Most basic dental services including extractions and fillings. Payment Options: PAYMENT IS DUE AT THE TIME OF SERVICE. Sliding scale, up to 50% off - bring proof if income or support. Medicaid with dental option accepted. Best way to get seen: Call to schedule an appointment, can usually be seen within 2 weeks OR they will try to see walk-ins - show up at 8a or 2p (you may have to wait).     [facility] [phone] [place] RESIDENTS ONLY   Location: [place], [address], [place], [place] [address] Clinic Hours: By appointment only. Monday - Thursday 8am-5pm, Friday 8am-12pm Services: Cleanings, fillings, extractions. Payment Options: PAYMENT IS DUE AT THE TIME OF SERVICE. Cash, Visa or MasterCard. Sliding scale - $30 minimum per service.  Best way to get seen: Come in to office, complete packet and make an appointment - need proof of  income or support monies for each household member and proof of [facility] residence. Usually takes about a month to get in.     [facility] [phone]   Location: [address], [facility] Clinic Hours: Walk-in Urgent Care Dental Services are offered Monday-Friday mornings only. The numbers of emergencies accepted daily is limited to the number of providers available. Maximum 15 - Mondays, Wednesdays & Thursdays Maximum 10 - Tuesdays & Fridays Services: You do not need to be a [facility] resident to be seen for a dental emergency. Emergencies are defined as pain, swelling, abnormal bleeding, or dental trauma. Walkins will receive x-rays if needed. NOTE: Dental cleaning is not an emergency. Payment Options: PAYMENT IS DUE AT THE TIME OF SERVICE. Minimum co-pay is $40.00 for uninsured patients. Minimum co-pay is $3.00 for Medicaid with dental coverage. Dental Insurance is accepted and must be presented at time of visit. Medicare does not cover dental. Forms of payment: Cash, credit card, checks. Best way to get seen: If not previously registered with the clinic, walk-in dental registration begins at 7:15 am and is on a first come/first serve basis. If previously registered with the clinic, call to make an appointment.     [facility] [phone] [place] RESIDENTS ONLY   Location: [address] [place], [place] Clinic Hours: Mon-Thu 10a-2p Services: Extractions only! Payment Options: FREE (donations accepted) - bring proof of income or support Best way to get seen: Call and schedule an appointment OR come at 8am on the 1st Monday of every month (except for holidays) when it is first come/first served.     [facility] [phone]   Location: [address] [place], [place] Clinic Hours: Friday mornings Services, Payment Options, Best way to get seen: Call for info

## 2021-09-09 NOTE — ED Provider Notes (Signed)
[facility] Provider Note    Event Date/Time   First MD Initiated Contact with Patient 09/09/21 1217     (approximate)   History   Dental Pain   HPI  Benjamin Corvin. is a 36 y.o. male   presents to the ED with complaint of dental pain.  Patient states that he is lower left molar has been broken for approximately 2 years and has not given him any problems until just recently.  Today his face is swollen and that general area.  Currently he does not have a dentist.  Patient has history of ADHD, anxiety, depression, GERD and polysubstance abuse.  He rates his pain as 5/5.      Physical Exam   Triage Vital Signs: ED Triage Vitals  Enc Vitals Group     BP 09/09/21 1215 125/82     Pulse Rate 09/09/21 1215 93     Resp 09/09/21 1215 17     Temp 09/09/21 1215 97.6 F (36.4 C)     Temp Source 09/09/21 1215 Oral     SpO2 09/09/21 1215 100 %     Weight 09/09/21 1212 170 lb (77.1 kg)     Height 09/09/21 1212 5\' 10"  (1.778 m)     Head Circumference --      Peak Flow --      Pain Score 09/09/21 1212 5     Pain Loc --      Pain Edu? --      Excl. in GC? --     Most recent vital signs: Vitals:   09/09/21 1215 09/09/21 1305  BP: 125/82 115/79  Pulse: 93 99  Resp: 17 18  Temp: 97.6 F (36.4 C)   SpO2: 100% 100%     General: Awake, no distress.  CV:  Good peripheral perfusion.  Resp:  Normal effort.  Abd:  No distention.  Other:  Left lower anterior molar with large caries present.  Gum in this area is moderately edematous but no active drainage is noted.  Left lower facial area adjacent to this tooth is swollen.   ED Results / Procedures / Treatments   Labs (all labs ordered are listed, but only abnormal results are displayed) Labs Reviewed - No data to display    PROCEDURES:  Critical Care performed:   Procedures   MEDICATIONS ORDERED IN ED: Medications - No data to display   IMPRESSION / MDM / ASSESSMENT AND PLAN / ED  COURSE  I reviewed the triage vital signs and the nursing notes.   Differential diagnosis includes, but is not limited to, dental cary, dental injury, dental abscess.  36 year old male presents to the ED with complaint of dental pain with history of a broken tooth for the approximately 2 years.  He states that today his face is swollen in the general area of his tooth.  Patient has a history of ADHD, anxiety, depression, GERD and polysubstance abuse.  He currently does not have a dentist.  A list of dental clinics was given to him.  Findings are consistent with a dental abscess and facial swelling adjacent to the involved tooth.  Patient was given prescription for amoxicillin 875 twice daily for 10 days and told to take Tylenol or ibuprofen as needed for inflammation and pain.  He is strongly encouraged to call one of the dental clinics on his discharge papers for further evaluation and treatment of this tooth.       FINAL CLINICAL IMPRESSION(S) /  ED DIAGNOSES   Final diagnoses:  Dental abscess     Rx / DC Orders   ED Discharge Orders          Ordered    amoxicillin (AMOXIL) 875 MG tablet  2 times daily        09/09/21 1250             Note:  This document was prepared using Dragon voice recognition software and may include unintentional dictation errors.   Tommi Rumps, PA-C 09/09/21 1407    Willy Eddy, MD 09/09/21 1452

## 2021-09-12 ENCOUNTER — Encounter: Payer: Self-pay | Admitting: Emergency Medicine

## 2021-09-12 ENCOUNTER — Emergency Department: Payer: 59

## 2021-09-12 ENCOUNTER — Emergency Department
Admission: EM | Admit: 2021-09-12 | Discharge: 2021-09-12 | Disposition: A | Discharge status: Home / Self Care | Payer: 59 | Attending: Emergency Medicine | Admitting: Emergency Medicine

## 2021-09-12 ENCOUNTER — Other Ambulatory Visit: Payer: Self-pay

## 2021-09-12 DIAGNOSIS — K047 Periapical abscess without sinus: Secondary | ICD-10-CM | POA: Diagnosis not present

## 2021-09-12 DIAGNOSIS — R6884 Jaw pain: Secondary | ICD-10-CM | POA: Diagnosis not present

## 2021-09-12 DIAGNOSIS — R221 Localized swelling, mass and lump, neck: Secondary | ICD-10-CM | POA: Diagnosis not present

## 2021-09-12 LAB — CBC
HCT: 43.3 % (ref 39.0–52.0)
Hemoglobin: 14.3 g/dL (ref 13.0–17.0)
MCH: 27.7 pg (ref 26.0–34.0)
MCHC: 33 g/dL (ref 30.0–36.0)
MCV: 83.8 fL (ref 80.0–100.0)
Platelets: 267 10*3/uL (ref 150–400)
RBC: 5.17 MIL/uL (ref 4.22–5.81)
RDW: 12.6 % (ref 11.5–15.5)
WBC: 7.7 10*3/uL (ref 4.0–10.5)
nRBC: 0 % (ref 0.0–0.2)

## 2021-09-12 LAB — BASIC METABOLIC PANEL
Anion gap: 9 (ref 5–15)
BUN: 13 mg/dL (ref 6–20)
CO2: 23 mmol/L (ref 22–32)
Calcium: 8.9 mg/dL (ref 8.9–10.3)
Chloride: 104 mmol/L (ref 98–111)
Creatinine, Ser: 1.39 mg/dL — ABNORMAL HIGH (ref 0.61–1.24)
GFR, Estimated: >60 mL/min (ref >60)
Glucose, Bld: 112 mg/dL — ABNORMAL HIGH (ref 70–99)
Potassium: 4.1 mmol/L (ref 3.5–5.1)
Sodium: 136 mmol/L (ref 135–145)

## 2021-09-12 LAB — LACTIC ACID, PLASMA
Lactic Acid, Venous: 1.6 mmol/L (ref 0.5–1.9)
Lactic Acid, Venous: 0.8 mmol/L (ref 0.5–1.9)

## 2021-09-12 MED ORDER — AMOXICILLIN-POT CLAVULANATE 875-125 MG PO TABS: 1.0000 | ORAL_TABLET | Freq: Two times a day (BID) | ORAL | 0 refills | Status: AC

## 2021-09-12 MED ORDER — OXYCODONE HCL 5 MG PO TABS
5.0000 mg | ORAL_TABLET | Freq: Once | ORAL | Status: DC
  Filled 2021-09-12: qty 1

## 2021-09-12 MED ORDER — KETOROLAC TROMETHAMINE 30 MG/ML IJ SOLN
15.0000 mg | Freq: Once | INTRAMUSCULAR | Status: AC
  Administered 2021-09-12: 15 mg via INTRAVENOUS
  Filled 2021-09-12: qty 1

## 2021-09-12 MED ORDER — SODIUM CHLORIDE 0.9 % IV SOLN
3.0000 g | Freq: Once | INTRAVENOUS | Status: AC
  Administered 2021-09-12: 3 g via INTRAVENOUS
  Filled 2021-09-12: qty 8

## 2021-09-12 MED ORDER — IOHEXOL 300 MG/ML  SOLN
75.0000 mL | Freq: Once | INTRAMUSCULAR | Status: AC | PRN
  Administered 2021-09-12: 75 mL via INTRAVENOUS
  Filled 2021-09-12: qty 75

## 2021-09-12 NOTE — ED Notes (Signed)
Pt has swelling to left jaw worsening with abx. Airway patent.

## 2021-09-12 NOTE — ED Provider Notes (Signed)
[facility] Provider Note    Event Date/Time   First MD Initiated Contact with Patient 09/12/21 1714     (approximate)   History   Jaw Pain   HPI  Benjamin Crane. is a 36 y.o. male with ADHD, anxiety who comes in with concerns for jaw pain.  On review of records patient was seen on 2/15 for dental pain and patient was discharged with amoxicillin 875 twice daily for 10 days.  Patient reports some increasing pain in his left jaw.  He denies any fevers.  He states he is taking ibuprofen to help with that.  Nothing seems to be making it feel better.   Physical Exam   Triage Vital Signs: ED Triage Vitals  Enc Vitals Group     BP 09/12/21 1613 133/90     Pulse Rate 09/12/21 1613 87     Resp 09/12/21 1613 16     Temp 09/12/21 1613 97.6 F (36.4 C)     Temp Source 09/12/21 1613 Oral     SpO2 09/12/21 1613 97 %     Weight 09/12/21 1611 165 lb (74.8 kg)     Height 09/12/21 1611 5\' 10"  (1.778 m)     Head Circumference --      Peak Flow --      Pain Score 09/12/21 1611 6     Pain Loc --      Pain Edu? --      Excl. in GC? --     Most recent vital signs: Vitals:   09/12/21 1613  BP: 133/90  Pulse: 87  Resp: 16  Temp: 97.6 F (36.4 C)  SpO2: 97%     General: Awake, no distress.  CV:  Good peripheral perfusion.  Resp:  Normal effort.  Abd:  No distention.  Other:  Patient has a broken left first molar tooth with obvious fluctuation noted in urine.  Able to open his mouth fully   ED Results / Procedures / Treatments   Labs (all labs ordered are listed, but only abnormal results are displayed) Labs Reviewed  BASIC METABOLIC PANEL - Abnormal; Notable for the following components:      Result Value   Glucose, Bld 112 (*)    Creatinine, Ser 1.39 (*)    All other components within normal limits  CBC  LACTIC ACID, PLASMA  LACTIC ACID, PLASMA     RADIOLOGY I have reviewed the CT  personally and Do see evidence of  abscess.  1. Soft tissue swelling with inflammatory stranding adjacent to the  left mandibular body, consistent with acute infection/cellulitis.  Superimposed 1.7 x 1.7 x 2.4 cm rim enhancing collection consistent  with abscess. These changes emanate from a carious left first  mandibular molar, suspected to be the source of infection.  2. Associated reactive regional adenopathy within the upper left  neck.       PROCEDURES:  Critical Care performed: No  ..Incision and Drainage  Date/Time: 09/12/2021 7:27 PM Performed by: 09/14/2021, MD Authorized by: Concha Se, MD   Consent:    Consent obtained:  Verbal   Consent given by:  Patient   Risks discussed:  Bleeding, incomplete drainage, infection, pain and damage to other organs   Alternatives discussed:  No treatment Universal protocol:    Patient identity confirmed:  Verbally with patient Location:    Type:  Abscess   Location:  Mouth   Mouth location:  Alveolar process Sedation:  Sedation type:  None Anesthesia:    Anesthesia method:  None Procedure details:    Drainage:  Purulent Post-procedure details:    Procedure completion:  Tolerated well, no immediate complications   MEDICATIONS ORDERED IN ED: Medications - No data to display   IMPRESSION / MDM / ASSESSMENT AND PLAN / ED COURSE  I reviewed the triage vital signs and the nursing notes.                              Differential diagnosis includes, but is not limited to, apical abscess but given the extent of swelling of the outside of his face will get CT scan to make sure no deeper infection that would require ENT consultation.  Labs ordered from triage Normal white count Slightly elevated creatinine on BMP Lactate normal  Patient had CT imaging as below.  I was able to do an I&D on this area with significant drainage noted.  We will give patient some Toradol and oxycodone to help with the pain here.  Patient does report history of drug abuse in  the past but not related to opioids.  We discussed wean off of the opioids just to help prevent any future injection but explained that he can alternate Tylenol and ibuprofen use the Augmentin to help.  Patient expressed understanding felt comfortable with this plan.  He reports his dentist that he can follow-up with this week we discussed warm compresses over the area as well.     FINAL CLINICAL IMPRESSION(S) / ED DIAGNOSES   Final diagnoses:  Dental abscess     Rx / DC Orders   ED Discharge Orders          Ordered    amoxicillin-clavulanate (AUGMENTIN) 875-125 MG tablet  2 times daily        09/12/21 1929             Note:  This document was prepared using Dragon voice recognition software and may include unintentional dictation errors.   Concha Se, MD 09/12/21 [phone]

## 2021-09-12 NOTE — ED Notes (Signed)
Pt discharge information reviewed. Pt understands need for follow up care and when to return if symptoms worsen. All questions answered. Pt is alert and oriented with even and regular respirations. Pt is seen ambulating out of department with string steady gait.   

## 2021-09-12 NOTE — ED Triage Notes (Signed)
Pt via POV from home. Pt c/o L jaw pain. Pt states he was seen on 2/15 and was d/c with antibiotics. Pt states pain and swelling are getting worse. Noticeable swelling to the L lower jaw. Pt is A&OX4 and NAD.

## 2021-09-12 NOTE — Discharge Instructions (Addendum)
Stop taking the amoxicillin and start taking the Augmentin.  Take Tylenol 1 g every 8 hours and ibuprofen 600 every 6-8 hours to help with pain.  Call a dentist to get follow-up appointment next week and return if you develop worsening pain, swelling, fevers  1. Soft tissue swelling with inflammatory stranding adjacent to the  left mandibular body, consistent with acute infection/cellulitis.  Superimposed 1.7 x 1.7 x 2.4 cm rim enhancing collection consistent  with abscess. These changes emanate from a carious left first  mandibular molar, suspected to be the source of infection.  2. Associated reactive regional adenopathy within the upper left  neck.

## 2021-09-14 ENCOUNTER — Ambulatory Visit: Payer: Self-pay | Admitting: Psychiatry

## 2021-11-02 ENCOUNTER — Telehealth: Payer: Self-pay | Admitting: Family Medicine

## 2021-11-02 DIAGNOSIS — F339 Major depressive disorder, recurrent, unspecified: Secondary | ICD-10-CM

## 2021-11-02 NOTE — Telephone Encounter (Signed)
Patient is requesting a new referral be sent to [facility]..  ? ?Please advise  ?

## 2021-11-02 NOTE — Telephone Encounter (Signed)
It looks like patient was referred to [facility] back in December. Patient's appointment was scheduled for 08/04/21 at 9:00. I called patient to ask why a new referral was needed. Patient does not need a referral to physical therapy. He is requesting a referral to a psychologist for depression. Patient has already been referred to psychiatry last year, and now he wants a referral to see a psychologist. Please advise on referral request. Patient prefers a local psychologist and anyone Dr. Sherrie Mustache recommends, that accepts his insurance.  ?

## 2021-12-03 ENCOUNTER — Telehealth: Payer: Self-pay

## 2021-12-03 DIAGNOSIS — F29 Unspecified psychosis not due to a substance or known physiological condition: Secondary | ICD-10-CM

## 2021-12-03 DIAGNOSIS — F339 Major depressive disorder, recurrent, unspecified: Secondary | ICD-10-CM

## 2021-12-03 NOTE — Telephone Encounter (Signed)
Have sent referral ?

## 2021-12-03 NOTE — Telephone Encounter (Signed)
Please Review and advise 

## 2021-12-03 NOTE — Addendum Note (Signed)
Addended by: Malva Limes on: 12/03/2021 09:01 PM ? ? Modules accepted: Orders ? ?

## 2021-12-03 NOTE — Telephone Encounter (Signed)
Patient called and stated that he would like a referral to [facility], phone [phone]. Please advise pt when referral is ordered. ?

## 2022-01-23 NOTE — Progress Notes (Deleted)
Psychiatric Initial Adult Assessment   Patient Identification: Benjamin Crane. MRN:  [id number] Date of Evaluation:  01/23/2022 Referral Source: *** Chief Complaint:  No chief complaint on file.  Visit Diagnosis: No diagnosis found.  History of Present Illness:   Benjamin Crane. is a 36 y.o. year old male with a history of cocaine, amphetamine abuse, psychosis, who is referred for depression.         Associated Signs/Symptoms: Depression Symptoms:  {DEPRESSION SYMPTOMS:20000} (Hypo) Manic Symptoms:  {BHH MANIC SYMPTOMS:22872} Anxiety Symptoms:  {BHH ANXIETY SYMPTOMS:22873} Psychotic Symptoms:  {BHH PSYCHOTIC SYMPTOMS:22874} PTSD Symptoms: {BHH PTSD SYMPTOMS:22875}  Past Psychiatric History:  Outpatient:  Psychiatry admission:  Previous suicide attempt:  Past trials of medication:  History of violence:    Previous Psychotropic Medications: {YES/NO:21197}  Substance Abuse History in the last 12 months:  {yes no:314532}  Consequences of Substance Abuse: {BHH CONSEQUENCES OF SUBSTANCE ABUSE:22880}  Past Medical History:  Past Medical History:  Diagnosis Date   ADHD    Anxiety    Cocaine abuse (HCC)    Depression    GERD (gastroesophageal reflux disease)     Past Surgical History:  Procedure Laterality Date   TONSILLECTOMY      Family Psychiatric History: ***  Family History:  Family History  Problem Relation Age of Onset   Cancer Mother    Cancer Father    Anxiety disorder Sister    Arthritis Sister    Arthritis Brother    Cancer Maternal Uncle    Congestive Heart Failure Maternal Grandmother    Heart disease Maternal Grandfather    Cancer Paternal Grandmother     Social History:   Social History   Socioeconomic History   Marital status: Married    Spouse name: Not on file   Number of children: Not on file   Years of education: Not on file   Highest education level: Not on file  Occupational History   Not on file  Tobacco Use    Smoking status: Former    Types: Cigarettes   Smokeless tobacco: Never  Vaping Use   Vaping Use: Every day  Substance and Sexual Activity   Alcohol use: No   Drug use: Not Currently    Types: Cocaine   Sexual activity: Not on file  Other Topics Concern   Not on file  Social History Narrative   Not on file   Social Determinants of Health   Financial Resource Strain: Not on file  Food Insecurity: Not on file  Transportation Needs: Not on file  Physical Activity: Not on file  Stress: Not on file  Social Connections: Not on file    Additional Social History: ***  Allergies:   Allergies  Allergen Reactions   Lactose Intolerance (Gi)     Metabolic Disorder Labs: No results found for: "HGBA1C", "MPG" No results found for: "PROLACTIN" No results found for: "CHOL", "TRIG", "HDL", "CHOLHDL", "VLDL", "LDLCALC" No results found for: "TSH"  Therapeutic Level Labs: No results found for: "LITHIUM" No results found for: "CBMZ" No results found for: "VALPROATE"  Current Medications: Current Outpatient Medications  Medication Sig Dispense Refill   ARIPiprazole (ABILIFY) 5 MG tablet Take 1 tablet (5 mg total) by mouth daily. 30 tablet 1   buPROPion (WELLBUTRIN) 75 MG tablet Take 1 tablet (75 mg total) by mouth daily. 30 tablet 1   citalopram (CELEXA) 40 MG tablet Take 1 tablet (40 mg total) by mouth daily. 30 tablet 1   gabapentin (NEURONTIN)  300 MG capsule Take 300 mg by mouth 3 (three) times daily.     pantoprazole (PROTONIX) 40 MG tablet Take 1 tablet (40 mg total) by mouth daily as needed. 30 tablet 1   No current facility-administered medications for this visit.    Musculoskeletal: Strength & Muscle Tone: within normal limits Gait & Station: normal Patient leans: N/A  Psychiatric Specialty Exam: Review of Systems  There were no vitals taken for this visit.There is no height or weight on file to calculate BMI.  General Appearance: {Appearance:22683}  Eye Contact:   {BHH EYE CONTACT:22684}  Speech:  Clear and Coherent  Volume:  Normal  Mood:  {BHH MOOD:22306}  Affect:  {Affect (PAA):22687}  Thought Process:  Coherent  Orientation:  Full (Time, Place, and Person)  Thought Content:  Logical  Suicidal Thoughts:  {ST/HT (PAA):22692}  Homicidal Thoughts:  {ST/HT (PAA):22692}  Memory:  Immediate;   Good  Judgement:  {Judgement (PAA):22694}  Insight:  {Insight (PAA):22695}  Psychomotor Activity:  Normal  Concentration:  Concentration: Good and Attention Span: Good  Recall:  Good  Fund of Knowledge:Good  Language: Good  Akathisia:  No  Handed:  Right  AIMS (if indicated):  not done  Assets:  Communication Skills Desire for Improvement  ADL's:  Intact  Cognition: WNL  Sleep:  {BHH GOOD/FAIR/POOR:22877}   Screenings: PHQ2-9    Flowsheet Row Office Visit from 07/10/2021 in [facility] Office Visit from 11/25/2020 in [facility] Office Visit from 10/10/2020 in [facility]  PHQ-2 Total Score 3 3 4   PHQ-9 Total Score 10 10 12       Flowsheet Row ED from 09/12/2021 [facility] from 09/09/2021 [facility] from 09[facility]21/2020 in [facility]  C-SSRS RISK CATEGORY No Risk No Risk No Risk       Assessment and Plan:  Assessment  Plan   The patient demonstrates the following risk factors for suicide: Chronic risk factors for suicide include: {Chronic Risk Factors for 09/23/2020. Acute risk factors for suicide include: {Acute Risk Factors for [facility]. Protective factors for this patient include: {Protective Factors for Suicide NFAOZHY:86578469}. Considering these factors, the overall suicide risk at this point appears to be {Desc; low/moderate/high:110033}. Patient {ACTION; IS/IS GEXBMWU:13244010} appropriate for outpatient follow up.       Collaboration of Care: {BH OP  Collaboration of Care:21014065}  Patient/Guardian was advised Release of Information must be obtained prior to any record release in order to collaborate their care with an outside provider. Patient/Guardian was advised if they have not already done so to contact the registration department to sign all necessary forms in order for UVOZ:[id number]} to release information regarding their care.   Consent: Patient/Guardian gives verbal consent for treatment and assignment of benefits for services provided during this visit. Patient/Guardian expressed understanding and agreed to proceed.   [id number]}, MD 7/1/20236:02 PM

## 2022-01-28 ENCOUNTER — Ambulatory Visit: Payer: Self-pay | Admitting: Psychiatry

## 2022-02-04 ENCOUNTER — Telehealth: Payer: Self-pay

## 2022-02-04 DIAGNOSIS — Z8659 Personal history of other mental and behavioral disorders: Secondary | ICD-10-CM

## 2022-02-04 DIAGNOSIS — F339 Major depressive disorder, recurrent, unspecified: Secondary | ICD-10-CM

## 2022-02-04 NOTE — Telephone Encounter (Signed)
Copied from CRM [id number]. Topic: Referral - Request for Referral >> Feb 04, 2022  8:58 AM Everette C wrote: Has patient seen PCP for this complaint? Yes.   *If NO, is insurance requiring patient see PCP for this issue before PCP can refer them? Referral for which specialty: Psychiatry  Preferred provider/office: [facility]  Reason for referral: Mental health services

## 2022-02-05 NOTE — Addendum Note (Signed)
Addended by: Malva Limes on: 02/05/2022 08:10 AM   Modules accepted: Orders

## 2022-03-03 ENCOUNTER — Other Ambulatory Visit: Payer: Self-pay | Admitting: Family Medicine

## 2022-03-03 ENCOUNTER — Telehealth: Payer: Self-pay | Admitting: Family Medicine

## 2022-03-03 DIAGNOSIS — F1911 Other psychoactive substance abuse, in remission: Secondary | ICD-10-CM

## 2022-03-03 DIAGNOSIS — F28 Other psychotic disorder not due to a substance or known physiological condition: Secondary | ICD-10-CM

## 2022-03-03 DIAGNOSIS — F32A Depression, unspecified: Secondary | ICD-10-CM

## 2022-03-03 MED ORDER — CITALOPRAM HYDROBROMIDE 20 MG PO TABS: 20.0000 mg | ORAL_TABLET | Freq: Every day | ORAL | 3 refills | Status: DC

## 2022-03-03 MED ORDER — OLANZAPINE 5 MG PO TABS: 5.0000 mg | ORAL_TABLET | Freq: Every evening | ORAL | 3 refills | Status: DC

## 2022-03-03 NOTE — Telephone Encounter (Signed)
Patient came in w/ father stating he missed his appt with psychiatry and wants another referral for that please.

## 2022-03-03 NOTE — Telephone Encounter (Signed)
Please advise Per referral note:  Pt has been dismissed from our office he has canceled / no showed 2 new patient appt.

## 2022-04-01 ENCOUNTER — Encounter: Payer: Self-pay | Admitting: Emergency Medicine

## 2022-04-01 ENCOUNTER — Other Ambulatory Visit: Payer: Self-pay

## 2022-04-01 ENCOUNTER — Emergency Department
Admission: EM | Admit: 2022-04-01 | Discharge: 2022-04-01 | Disposition: A | Discharge status: Home / Self Care | Payer: 59 | Attending: Emergency Medicine | Admitting: Emergency Medicine

## 2022-04-01 DIAGNOSIS — Z20822 Contact with and (suspected) exposure to covid-19: Secondary | ICD-10-CM | POA: Insufficient documentation

## 2022-04-01 DIAGNOSIS — Z87891 Personal history of nicotine dependence: Secondary | ICD-10-CM | POA: Insufficient documentation

## 2022-04-01 DIAGNOSIS — F430 Acute stress reaction: Secondary | ICD-10-CM | POA: Diagnosis not present

## 2022-04-01 DIAGNOSIS — R69 Illness, unspecified: Secondary | ICD-10-CM | POA: Diagnosis not present

## 2022-04-01 DIAGNOSIS — R259 Unspecified abnormal involuntary movements: Secondary | ICD-10-CM | POA: Diagnosis not present

## 2022-04-01 DIAGNOSIS — Z046 Encounter for general psychiatric examination, requested by authority: Secondary | ICD-10-CM | POA: Diagnosis not present

## 2022-04-01 DIAGNOSIS — R4689 Other symptoms and signs involving appearance and behavior: Secondary | ICD-10-CM

## 2022-04-01 LAB — CBC
HCT: 45.6 % (ref 39.0–52.0)
Hemoglobin: 15.3 g/dL (ref 13.0–17.0)
MCH: 28.3 pg (ref 26.0–34.0)
MCHC: 33.6 g/dL (ref 30.0–36.0)
MCV: 84.4 fL (ref 80.0–100.0)
Platelets: 223 10*3/uL (ref 150–400)
RBC: 5.4 MIL/uL (ref 4.22–5.81)
RDW: 13.3 % (ref 11.5–15.5)
WBC: 7.7 10*3/uL (ref 4.0–10.5)
nRBC: 0 % (ref 0.0–0.2)

## 2022-04-01 LAB — COMPREHENSIVE METABOLIC PANEL
ALT: 28 U/L (ref 0–44)
AST: 23 U/L (ref 15–41)
Albumin: 4.7 g/dL (ref 3.5–5.0)
Alkaline Phosphatase: 99 U/L (ref 38–126)
Anion gap: 8 (ref 5–15)
BUN: 16 mg/dL (ref 6–20)
CO2: 25 mmol/L (ref 22–32)
Calcium: 9.5 mg/dL (ref 8.9–10.3)
Chloride: 110 mmol/L (ref 98–111)
Creatinine, Ser: 1.08 mg/dL (ref 0.61–1.24)
GFR, Estimated: >60 mL/min (ref >60)
Glucose, Bld: 93 mg/dL (ref 70–99)
Potassium: 3.9 mmol/L (ref 3.5–5.1)
Sodium: 143 mmol/L (ref 135–145)
Total Bilirubin: 0.5 mg/dL (ref 0.3–1.2)
Total Protein: 7.6 g/dL (ref 6.5–8.1)

## 2022-04-01 LAB — URINE DRUG SCREEN, QUALITATIVE (ARMC ONLY)
Amphetamines, Ur Screen: NOT DETECTED
Barbiturates, Ur Screen: NOT DETECTED
Benzodiazepine, Ur Scrn: NOT DETECTED
Cannabinoid 50 Ng, Ur ~~LOC~~: NOT DETECTED
Cocaine Metabolite,Ur ~~LOC~~: NOT DETECTED
MDMA (Ecstasy)Ur Screen: NOT DETECTED
Methadone Scn, Ur: NOT DETECTED
Opiate, Ur Screen: NOT DETECTED
Phencyclidine (PCP) Ur S: NOT DETECTED
Tricyclic, Ur Screen: NOT DETECTED

## 2022-04-01 LAB — ACETAMINOPHEN LEVEL: Acetaminophen (Tylenol), Serum: <10 ug/mL — ABNORMAL LOW (ref 10–30)

## 2022-04-01 LAB — RESP PANEL BY RT-PCR (FLU A&B, COVID) ARPGX2
Influenza A by PCR: NEGATIVE
Influenza B by PCR: NEGATIVE
SARS Coronavirus 2 by RT PCR: NEGATIVE

## 2022-04-01 LAB — SALICYLATE LEVEL: Salicylate Lvl: <7 mg/dL — ABNORMAL LOW (ref 7.0–30.0)

## 2022-04-01 LAB — ETHANOL: Alcohol, Ethyl (B): <10 mg/dL (ref <10)

## 2022-04-01 MED ORDER — OLANZAPINE 5 MG PO TABS: 5.0000 mg | ORAL_TABLET | Freq: Every evening | ORAL | Status: DC

## 2022-04-01 MED ORDER — CITALOPRAM HYDROBROMIDE 20 MG PO TABS
20.0000 mg | ORAL_TABLET | Freq: Every day | ORAL | Status: DC
  Filled 2022-04-01: qty 1

## 2022-04-01 NOTE — ED Notes (Signed)
Lunch and drink provided. 
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
## 2022-04-01 NOTE — ED Triage Notes (Signed)
Patient ambulatory to triage with steady gait, without difficulty or distress noted, in custody of Product manager for IVC: pt denies SI or HI and st he does not know why he is here

## 2022-04-01 NOTE — ED Notes (Signed)
With this nurse and male officer present, pt removes white t-shirt, plaid pajama pants, blue boxers-all placed in labeled pt belonging bag to be secured on nursing unit and pt changed into behav scrubs

## 2022-04-01 NOTE — ED Notes (Signed)
IVC PAPERS  RESCINDED  PER  JAMIE  LORD  NP  INFORMED  JANE  RN
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
## 2022-04-01 NOTE — ED Notes (Signed)
AAOx3  skin warm and dry. NAD. Calm and cooperative.  D/c

## 2022-04-01 NOTE — ED Notes (Signed)
Patient got a breakfast tray and a drink.

## 2022-04-01 NOTE — ED Notes (Signed)

## 2022-04-01 NOTE — ED Notes (Signed)
[facility] PENDING  CONSULT ?

## 2022-06-10 ENCOUNTER — Telehealth: Payer: Self-pay

## 2022-06-10 NOTE — Telephone Encounter (Signed)
Copied from CRM [id number]. Topic: Appointment Scheduling - Scheduling Inquiry for Clinic >> Jun 10, 2022 10:48 AM Benjamin Crane wrote: Reason for CRM: pt states his probation officer wants him to have a mental health evaluation.  Pt wants to know if Benjamin Crane will do this? Pt states he needs it done by 06/14/2022.  He states if he doesn't get this done, he says good chance he will arrested.

## 2022-06-10 NOTE — Telephone Encounter (Addendum)
Tried returning patients call. Left message to call back. Dr. Sherrie Mustache is out of the office today. I spoke with Alfredia Ferguson, PA-C. She states that patient will need to be see by a psychiatrist for a mental health evaluation. If he needs the evaluation done by 06/14/2022, he would probably need to go to the ED since a psychiatry referral  appointment referral may take over a month to get him in. OK for [facility] triage to speak with patient.

## 2022-06-11 NOTE — Telephone Encounter (Signed)
Patient called, left VM to return the call to the office to speak to a nurse. 

## 2022-06-11 NOTE — Telephone Encounter (Signed)
Patient returned call- Patient states as requirement of probation- he has to be evaluated by psychiatrist. Patient states he is not having any mental problems at this time. Patient advised of provider recommendation. Other option: [facility] [place] [facility] e able to help- they have therapist located on site for follow up if needed. Patient thankful for advise. ( Patient is presently living in [place])

## 2022-07-27 DIAGNOSIS — L03113 Cellulitis of right upper limb: Secondary | ICD-10-CM | POA: Diagnosis not present

## 2023-02-28 ENCOUNTER — Ambulatory Visit (INDEPENDENT_AMBULATORY_CARE_PROVIDER_SITE_OTHER): Payer: 59 | Admitting: Family Medicine

## 2023-02-28 ENCOUNTER — Encounter: Payer: Self-pay | Admitting: Family Medicine

## 2023-02-28 VITALS — BP 109/65 | HR 79 | Ht 71.0 in | Wt 162.4 lb

## 2023-02-28 DIAGNOSIS — F32A Depression, unspecified: Secondary | ICD-10-CM

## 2023-02-28 DIAGNOSIS — F430 Acute stress reaction: Secondary | ICD-10-CM

## 2023-02-28 DIAGNOSIS — F29 Unspecified psychosis not due to a substance or known physiological condition: Secondary | ICD-10-CM | POA: Diagnosis not present

## 2023-02-28 MED ORDER — CITALOPRAM HYDROBROMIDE 20 MG PO TABS: ORAL_TABLET | ORAL | 1 refills | Status: DC

## 2023-02-28 MED ORDER — PALIPERIDONE ER 6 MG PO TB24: 6.0000 mg | ORAL_TABLET | Freq: Every day | ORAL | 1 refills | Status: DC

## 2023-02-28 NOTE — Progress Notes (Signed)
Established patient visit   Patient: Benjamin Crane.   DOB: 08/06/1985   36 y.o. Male  MRN: [id number] Visit Date: 02/28/2023  Today's healthcare provider: Mila Merry, MD   Chief Complaint  Patient presents with   Medication Management   Subjective    Discussed the use of AI scribe software for clinical note transcription with the patient, who gave verbal consent to proceed.  History of Present Illness   The patient, with a history of psychosis, polysubstance abuse, anxiety and depression, presents for a medication review. He reports having been on Invega in both injectable and pill form in the past, with the last injection received in 2021. The patient states that he did well on the pill form of Invega, but experienced significant weight gain. He expresses a desire to restart Invega at a base dosage.  He was also prescribed citalopram in the past for depression and anxiety. He denies experiencing significant anxiety or mood swings at this time. He denies seeing or hearing things that others do not, but his father reports that he talks to someone who is not there.   The patient has recently moved back with father after living in [place] for extended period of time. He went to [facility] on Saturday for evaluation and recommended following up at [facility] or [facility] in [place]. He state he has been clean from substance use for a significant period.       Medications: Outpatient Medications Prior to Visit  Medication Sig Note   pantoprazole (PROTONIX) 40 MG tablet Take 1 tablet (40 mg total) by mouth daily as needed.    [DISCONTINUED] OLANZapine (ZYPREXA) 5 MG tablet Take 1 tablet (5 mg total) by mouth every evening. 02/28/2023: never started   [DISCONTINUED] buPROPion (WELLBUTRIN) 75 MG tablet Take 1 tablet (75 mg total) by mouth daily. (Patient not taking: Reported on 02/28/2023) 02/28/2023: hasn't taken for years   [DISCONTINUED] citalopram (CELEXA) 20  MG tablet Take 1 tablet (20 mg total) by mouth daily. (Patient not taking: Reported on 02/28/2023)    [DISCONTINUED] gabapentin (NEURONTIN) 300 MG capsule Take 300 mg by mouth 3 (three) times daily. (Patient not taking: Reported on 02/28/2023) 02/28/2023: hasn't taken for years   No facility-administered medications prior to visit.   Review of Systems     Objective    BP 109/65   Pulse 79   Ht 5\' 11"  (1.803 m)   Wt 162 lb 6.4 oz (73.7 kg)   BMI 22.65 kg/m   Physical Exam  Physical Exam        No results found for any visits on 02/28/23.  Assessment & Plan     Assessment and Plan    Psychiatric Disorder Patient previously managed on Invega (paliperidone) injections and oral form, but discontinued due to cost and weight gain. Patient reports doing well on oral form of Invega. No current hallucinations reported, but family member reports patient talking to someone not present. -Start oral Invega at base dosage. -Refer to [facility] for psychiatric follow-up within 30 days.  Substance Use Disorder Patient reports being clean for a significant period. -Encourage continued abstinence.  General Health Maintenance / Followup Plans -Check in with clinic if issues arise with insurance or referral to [facility]. -Follow-up appointment after patient has seen psychiatrist at [facility].       No follow-ups on file.      Mila Merry, MD  [facility] [phone] (phone) [phone] (fax)  [facility]

## 2023-03-03 ENCOUNTER — Ambulatory Visit: Payer: Self-pay

## 2023-03-03 NOTE — Telephone Encounter (Signed)
  Chief Complaint: Medication side effect - Stiffness  on right side, skin feels tight, feels dazed Symptoms: above Frequency: yesterday Pertinent Negatives: Patient denies  Disposition: [] ED /[] Urgent Care (no appt availability in office) / [x] Appointment(In office/virtual)/ []  [facility]/ [] Home Care/ [] Refused Recommended Disposition /[] [facility]/ []  Follow-up with PCP Additional Notes: Pt started 2 medications on 03/01/2023 and since then has had some side effects. He states that the right side of his body feels "stiff", his skin feels tight, and he feels like he is in a "daze" pt has taken Citalopram in the past and has not had these side effects. He thinks it may be the new medication.  Appt for tomorrow morning.  Please advise if need to be seen sooner.    Reason for Disposition  [1] Caller has URGENT medicine question about med that PCP or specialist prescribed AND [2] triager unable to answer question  Answer Assessment - Initial Assessment Questions 1. NAME of MEDICINE: "What medicine(s) are you calling about?"     Citalopram and Paliperidone together 2. QUESTION: "What is your question?" (e.g., double dose of medicine, side effect)     Pt states he is having side effects 3. PRESCRIBER: "Who prescribed the medicine?" Reason: if prescribed by specialist, call should be referred to that group.     Dr. Sherrie Mustache 4. SYMPTOMS: "Do you have any symptoms?" If Yes, ask: "What symptoms are you having?"  "How bad are the symptoms (e.g., mild, moderate, severe)     Skin feel tight - weird feeling on right side , stiffness, feels dazed.  Protocols used: Medication Question Call-A-AH

## 2023-03-04 ENCOUNTER — Ambulatory Visit (INDEPENDENT_AMBULATORY_CARE_PROVIDER_SITE_OTHER): Payer: 59 | Admitting: Family Medicine

## 2023-03-04 ENCOUNTER — Encounter: Payer: Self-pay | Admitting: Family Medicine

## 2023-03-04 VITALS — BP 115/85 | HR 77 | Temp 97.5°F | Ht 70.0 in | Wt 165.0 lb

## 2023-03-04 DIAGNOSIS — F1911 Other psychoactive substance abuse, in remission: Secondary | ICD-10-CM | POA: Diagnosis not present

## 2023-03-04 DIAGNOSIS — M273 Alveolitis of jaws: Secondary | ICD-10-CM

## 2023-03-04 DIAGNOSIS — T50905A Adverse effect of unspecified drugs, medicaments and biological substances, initial encounter: Secondary | ICD-10-CM

## 2023-03-04 DIAGNOSIS — F0630 Mood disorder due to known physiological condition, unspecified: Secondary | ICD-10-CM | POA: Insufficient documentation

## 2023-03-04 DIAGNOSIS — F39 Unspecified mood [affective] disorder: Secondary | ICD-10-CM | POA: Diagnosis not present

## 2023-03-04 MED ORDER — ARIPIPRAZOLE 2 MG PO TABS: 2.0000 mg | ORAL_TABLET | Freq: Every day | ORAL | 1 refills | Status: DC

## 2023-03-04 MED ORDER — AMOXICILLIN-POT CLAVULANATE 875-125 MG PO TABS: 1.0000 | ORAL_TABLET | Freq: Two times a day (BID) | ORAL | 0 refills | Status: DC

## 2023-03-04 NOTE — Assessment & Plan Note (Signed)
Acute, uncomfortable Pt requests for oral abx given broken tooth and delay in seeing dentist Strict adherence to medication recommended and close follow up with dental given concern for sepsis or endocarditis from untreated abscess

## 2023-03-04 NOTE — Assessment & Plan Note (Signed)
Pt was started on oral generic invega at 6 mg on 8/5 and immediately had side effects that were concerning Pt previously tolerated both oral and injectable forms of medication Pt d/c'd medication iso se Se included R side neck stiffness extending into face and causing weakness in his gait; improved since discontinuation  F/u in 1 month with PCP

## 2023-03-04 NOTE — Progress Notes (Signed)
Established patient visit   Patient: Benjamin Crane.   DOB: [birth year]   37 y.o. Male  MRN: [id number] Visit Date: 03/04/2023  Today's healthcare provider: Jacky Kindle, FNP  Introduced to nurse practitioner role and practice setting.  All questions answered.  Discussed provider/patient relationship and expectations.  Subjective    HPI HPI     Medication Reaction    Additional comments: Paliperidone, caused patient to have adverse reaction, started on 02/28/23. Reaction was stiffness in neck and face on right side and radiates down the right side of body to wear gait is affected       Last edited by Rolly Salter, CMA on 03/04/2023  8:23 AM.       Medications: Outpatient Medications Prior to Visit  Medication Sig   citalopram (CELEXA) 20 MG tablet Take 1/2 tablet daily for six days, then take 1 tablet daily   pantoprazole (PROTONIX) 40 MG tablet Take 1 tablet (40 mg total) by mouth daily as needed.   [DISCONTINUED] paliperidone (INVEGA) 6 MG 24 hr tablet Take 1 tablet (6 mg total) by mouth daily. (Patient not taking: Reported on 03/04/2023)   No facility-administered medications prior to visit.     Objective    BP 115/85 (BP Location: Left Arm, Patient Position: Sitting, Cuff Size: Normal)   Pulse 77   Temp (!) 97.5 F (36.4 C) (Oral)   Ht 5\' 10"  (1.778 m)   Wt 165 lb (74.8 kg)   SpO2 99%   BMI 23.68 kg/m   BP Readings from Last 3 Encounters:  03/04/23 115/85  02/28/23 109/65  04/01/22 115/82   Wt Readings from Last 3 Encounters:  03/04/23 165 lb (74.8 kg)  02/28/23 162 lb 6.4 oz (73.7 kg)  04/01/22 167 lb (75.8 kg)   Physical Exam Vitals and nursing note reviewed.  Constitutional:      Appearance: Normal appearance. He is normal weight.  HENT:     Head: Normocephalic and atraumatic.     Mouth/Throat:     Pharynx: Posterior oropharyngeal erythema present.  Cardiovascular:     Rate and Rhythm: Normal rate and regular rhythm.     Pulses: Normal  pulses.     Heart sounds: Normal heart sounds.  Pulmonary:     Effort: Pulmonary effort is normal.     Breath sounds: Normal breath sounds.  Musculoskeletal:        General: Normal range of motion.     Cervical back: Normal range of motion.  Skin:    General: Skin is warm and dry.     Capillary Refill: Capillary refill takes less than 2 seconds.  Neurological:     General: No focal deficit present.     Mental Status: He is alert and oriented to person, place, and time. Mental status is at baseline.  Psychiatric:        Mood and Affect: Mood normal.        Behavior: Behavior normal.        Thought Content: Thought content normal.        Judgment: Judgment normal.      No results found for any visits on 03/04/23.  Assessment & Plan     Problem List Items Addressed This Visit       Digestive   Infection of tooth socket    Acute, uncomfortable Pt requests for oral abx given broken tooth and delay in seeing dentist Strict adherence to medication recommended and close follow up  with dental given concern for sepsis or endocarditis from untreated abscess       Relevant Medications   amoxicillin-clavulanate (AUGMENTIN) 875-125 MG tablet     Other   Medication side effect, initial encounter - Primary    Pt was started on oral generic invega at 6 mg on 8/5 and immediately had side effects that were concerning Pt previously tolerated both oral and injectable forms of medication Pt d/c'd medication iso se Se included R side neck stiffness extending into face and causing weakness in his gait; improved since discontinuation  F/u in 1 month with PCP      Mood disorder (HCC)    Chronic, stable Reports side effects from restart of generic, oral invega at 6 mg dose Pt declines start at 3 mg of 1/2 dose given concern for side effects Reports he would rather start back on abilify despite previous weight gain to assist ongoing mood disorder Recommend strict f/u in 1 month with PCP and  continued conversations if recurrent se are noted Pt has the support of his father to assist with this process; present at today's OV      Relevant Medications   ARIPiprazole (ABILIFY) 2 MG tablet   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     Leilani Merl, FNP, have reviewed all documentation for this visit. The documentation on 03/04/23 for the exam, diagnosis, procedures, and orders are all accurate and complete.  Jacky Kindle, FNP  [facility] [phone] (phone) [phone] (fax)  [facility]

## 2023-03-04 NOTE — Assessment & Plan Note (Signed)
Chronic, stable Reports side effects from restart of generic, oral invega at 6 mg dose Pt declines start at 3 mg of 1/2 dose given concern for side effects Reports he would rather start back on abilify despite previous weight gain to assist ongoing mood disorder Recommend strict f/u in 1 month with PCP and continued conversations if recurrent se are noted Pt has the support of his father to assist with this process; present at today's OV

## 2023-03-04 NOTE — Assessment & Plan Note (Signed)
Chronic, remains sober Congratulated Has left messages for [facility] and [facility]; has not received return calls

## 2023-04-04 ENCOUNTER — Ambulatory Visit: Payer: 59 | Admitting: Family Medicine

## 2023-05-18 ENCOUNTER — Emergency Department
Admission: EM | Admit: 2023-05-18 | Discharge: 2023-05-18 | Disposition: A | Discharge status: Home / Self Care | Payer: MEDICAID | Attending: Emergency Medicine | Admitting: Emergency Medicine

## 2023-05-18 ENCOUNTER — Other Ambulatory Visit: Payer: Self-pay

## 2023-05-18 DIAGNOSIS — L0201 Cutaneous abscess of face: Secondary | ICD-10-CM | POA: Insufficient documentation

## 2023-05-18 MED ORDER — SULFAMETHOXAZOLE-TRIMETHOPRIM 800-160 MG PO TABS: 2.0000 | ORAL_TABLET | Freq: Two times a day (BID) | ORAL | 0 refills | Status: AC

## 2023-05-18 MED ORDER — LIDOCAINE HCL (PF) 1 % IJ SOLN
5.0000 mL | Freq: Once | INTRAMUSCULAR | Status: AC
  Administered 2023-05-18: 5 mL
  Filled 2023-05-18: qty 5

## 2023-05-18 NOTE — Discharge Instructions (Addendum)
You were seen in the ER today for your facial abscess.  We did perform an incision and drainage of this.  I sent a prescription for antibiotics to your pharmacy.  Please take these as directed.  Follow with your primary care doctor for further evaluation.  Return to the ER for new or worsening symptoms.

## 2023-05-18 NOTE — ED Triage Notes (Signed)
Pt c/o abscess on the right side of his face from an ingrown hair x2 days. Pt has swelling and redness across his cheek.

## 2023-05-18 NOTE — ED Provider Notes (Signed)
[facility] Provider Note    Event Date/Time   First MD Initiated Contact with Patient 05/18/23 1317     (approximate)   History   Abscess   HPI  Benjamin Crane. is a 37 year old male presenting to the Emergency Department for evaluation of facial abscess.  Patient thinks that he had an ingrown hair in the area that he removed, but a few days ago began to notice some developing redness in the area that is worsened.  No fevers or chills.  Swelling worsened today leading to ER presentation.  Ports history of prior facial abscesses that required drainage.     Physical Exam   Triage Vital Signs: ED Triage Vitals  Encounter Vitals Group     BP 05/18/23 1305 102/65     Systolic BP Percentile --      Diastolic BP Percentile --      Pulse Rate 05/18/23 1305 (!) 109     Resp 05/18/23 1305 16     Temp 05/18/23 1305 98.5 F (36.9 C)     Temp Source 05/18/23 1305 Oral     SpO2 05/18/23 1305 97 %     Weight 05/18/23 1306 165 lb (74.8 kg)     Height 05/18/23 1306 5\' 11"  (1.803 m)     Head Circumference --      Peak Flow --      Pain Score 05/18/23 1305 3     Pain Loc --      Pain Education --      Exclude from Growth Chart --     Most recent vital signs: Vitals:   05/18/23 1305  BP: 102/65  Pulse: (!) 109  Resp: 16  Temp: 98.5 F (36.9 C)  SpO2: 97%     General: Awake, interactive  Face:  Looks like swelling over the right cheek just above the patient's facial hair with some associated induration, warm, erythematous, fluid pocket noted on ultrasound CV:  Regular rate at the time of my evaluation, good peripheral perfusion.  Resp:  Unlabored respirations.  Abd:  Nondistended.  Neuro:  Symmetric facial movement, fluid speech   ED Results / Procedures / Treatments   Labs (all labs ordered are listed, but only abnormal results are displayed) Labs Reviewed - No data to display   EKG EKG independently reviewed interpreted by myself  (ER attending) demonstrates:    RADIOLOGY Imaging independently reviewed and interpreted by myself demonstrates:    PROCEDURES:  Critical Care performed: No  Procedures   MEDICATIONS ORDERED IN ED: Medications  lidocaine (PF) (XYLOCAINE) 1 % injection 5 mL (5 mLs Infiltration Given by Other 05/18/23 1337)     IMPRESSION / MDM / ASSESSMENT AND PLAN / ED COURSE  I reviewed the triage vital signs and the nursing notes.  Differential diagnosis includes, but is not limited to, cellulitis, abscess, ingrown hair  Patient's presentation is most consistent with acute complicated illness / injury requiring diagnostic workup.  37 year old male presenting with facial swelling.  Appears to have area of cellulitis with associated induration.  Viewed under ultrasound with fluid pocket noted.  I&D performed with return of purulent material.  Dressing applied.  Will DC with prescription for antibiotics.  Reports Bactrim has previously been effective for his abscesses, so will DC with this.  Strict return precautions provided.  Patient discharged in stable condition.      FINAL CLINICAL IMPRESSION(S) / ED DIAGNOSES   Final diagnoses:  Facial abscess  Rx / DC Orders   ED Discharge Orders          Ordered    sulfamethoxazole-trimethoprim (BACTRIM DS) 800-160 MG tablet  2 times daily        05/18/23 1444             Note:  This document was prepared using Dragon voice recognition software and may include unintentional dictation errors.   Trinna Post, MD 05/18/23 [phone]

## 2023-05-18 NOTE — ED Notes (Signed)
See triage notes. Patient c/o swelling and redness to his right cheek.

## 2023-05-24 ENCOUNTER — Encounter: Payer: Self-pay | Admitting: Psychiatry

## 2023-05-24 ENCOUNTER — Telehealth: Payer: Self-pay

## 2023-05-24 ENCOUNTER — Ambulatory Visit (INDEPENDENT_AMBULATORY_CARE_PROVIDER_SITE_OTHER): Payer: 59 | Admitting: Psychiatry

## 2023-05-24 VITALS — BP 119/72 | HR 77 | Temp 97.8°F | Ht 71.0 in | Wt 160.0 lb

## 2023-05-24 DIAGNOSIS — Z79899 Other long term (current) drug therapy: Secondary | ICD-10-CM | POA: Diagnosis not present

## 2023-05-24 DIAGNOSIS — F29 Unspecified psychosis not due to a substance or known physiological condition: Secondary | ICD-10-CM

## 2023-05-24 DIAGNOSIS — F1520 Other stimulant dependence, uncomplicated: Secondary | ICD-10-CM

## 2023-05-24 DIAGNOSIS — F1590 Other stimulant use, unspecified, uncomplicated: Secondary | ICD-10-CM

## 2023-05-24 DIAGNOSIS — F259 Schizoaffective disorder, unspecified: Secondary | ICD-10-CM

## 2023-05-24 DIAGNOSIS — Z9189 Other specified personal risk factors, not elsewhere classified: Secondary | ICD-10-CM | POA: Insufficient documentation

## 2023-05-24 MED ORDER — ARIPIPRAZOLE 5 MG PO TABS: 5.0000 mg | ORAL_TABLET | Freq: Every morning | ORAL | 0 refills | Status: DC

## 2023-05-24 MED ORDER — ARIPIPRAZOLE ER 400 MG IM PRSY: 400.0000 mg | PREFILLED_SYRINGE | INTRAMUSCULAR | 4 refills | Status: DC

## 2023-05-24 NOTE — Patient Instructions (Addendum)
Please call for EKG - [phone]   [email or web address]  [email or web address]  [email or web address] [facility] [address], [place], [place] [address]   [phone]  [facility] [email or web address] [address], [place], [place] [address]  [phone]  [facility] [email or web address] [address], [place], [place] [address]  [phone]   [facility] - [phone]  [facility] - [phone]  [facility] - [phone] counseling [phone]  [facility] [phone]   [email or web address] this clinician can offer telehealth and has a sliding scale option  [email or web address] this group also offers sliding scale rates and is based out of [place]  Dr. Liborio Nixon with the [facility] specializes in divorce  [employer] [facility] has interns who offer sliding scale rates and some of the full time clinicians do, as well. You complete their contact form on their website and the referrals coordinator will help to get connected to someone   Medicaid below :  [facility] [address] [place], [place] [address]  [phone]    [employer] [address] [place], [place] [address]  [phone]    [facility] [address] [place], [place] [address]  [phone]     Aripiprazole Tablets What is this medication? ARIPIPRAZOLE (ay ri PIP ray zole) treats schizophrenia, bipolar I disorder, autism spectrum disorder, and Tourette disorder. It may also be used with antidepressant medications to treat depression. It works by balancing the levels of dopamine and serotonin in the brain, hormones that help regulate mood,  behaviors, and thoughts. It belongs to a group of medications called antipsychotics. Antipsychotics can be used to treat several kinds of mental health conditions. This medicine may be used for other purposes; ask your health care provider or pharmacist if you have questions. COMMON BRAND NAME(S): Abilify What should I tell my care team before I take this medication? They need to know if you have any of these conditions: Dementia Diabetes Difficulty swallowing Have trouble controlling your muscles Heart disease History of irregular heartbeat History of stroke Low blood cell levels (white cells, red cells, and platelets) Low blood pressure Parkinson disease Seizures Suicidal thoughts, plans, or attempt by you or a family member Urges to engage in impulsive behaviors in ways that are unusual for you An unusual or allergic reaction to aripiprazole, other medications, foods, dyes, or preservatives Pregnant or trying to get pregnant Breastfeeding How should I use this medication? Take this medication by mouth with a glass of water. Take it as directed on the prescription label at the same time every day. You can take it with or without food. If it upsets your stomach, take it with food. Do not take your medication more often than directed. Keep taking it unless your care team tells you to stop. A special MedGuide will be given to you by the pharmacist with each prescription and refill. Be sure to read this information carefully each time. Talk to your care team about the use of this medication in children. While it may be prescribed for children as young as 6 years for selected conditions, precautions do apply. Overdosage: If you think you have taken too much of this medicine contact a poison control center or emergency room at once. NOTE: This medicine is only for you. Do not share  this medicine with others. What if I miss a dose? If you miss a dose, take it as soon as you can. If it is  almost time for your next dose, take only that dose. Do not take double or extra doses. What may interact with this medication? Do not take this medication with any of the following: Brexpiprazole Cisapride Dextromethorphan; quinidine Dronedarone Metoclopramide Pimozide Quinidine Thioridazine This medication may also interact with the following: Antihistamines for allergy, cough, and cold Carbamazepine Certain medications for anxiety or sleep Certain medications for depression, such as amitriptyline, fluoxetine, paroxetine, or sertraline Certain medications for fungal infections, such as fluconazole, itraconazole, ketoconazole, posaconazole, or voriconazole Clarithromycin General anesthetics, such as halothane, isoflurane, methoxyflurane, or propofol Medications for Parkinson disease, such as levodopa Medications for blood pressure Medications for seizures Medications that relax muscles for surgery Opioid medications for pain Other medications that cause heart rhythm changes Phenothiazines, such as chlorpromazine or prochlorperazine Rifampin This list may not describe all possible interactions. Give your health care provider a list of all the medicines, herbs, non-prescription drugs, or dietary supplements you use. Also tell them if you smoke, drink alcohol, or use illegal drugs. Some items may interact with your medicine. What should I watch for while using this medication? Visit your care team for regular checks on your progress. Tell your care team if your symptoms do not start to get better or if they get worse. Do not suddenly stop taking this medication. You may develop a severe reaction. Your care team will tell you how much medication to take. If your care team wants you to stop the medication, the dose may be slowly lowered over time to avoid any side effects. Patients and their families should watch out for new or worsening depression or thoughts of suicide. Also watch out for  sudden changes in feelings such as feeling anxious, agitated, panicky, irritable, hostile, aggressive, impulsive, severely restless, overly excited and hyperactive, or not being able to sleep. If this happens, especially at the beginning of antidepressant treatment or after a change in dose, call your care team. This medication may affect your coordination, reaction time, or judgment. Do not drive or operate machinery until you know how this medication affects you. Sit up or stand slowly to reduce the risk of dizzy or fainting spells. Drinking alcohol with this medication can increase the risk of these side effects. This medication can cause problems with controlling your body temperature. It can lower the response of your body to cold temperatures. If possible, stay indoors during cold weather. If you must go outdoors, wear warm clothes. It can also lower the response of your body to heat. Do not overheat. Do not over-exercise. Stay out of the sun when possible. If you must be in the sun, wear cool clothing. Drink plenty of water. If you have trouble controlling your body temperature, call your care team right away. This medication may cause dry eyes and blurred vision. If you wear contact lenses, you may feel some discomfort. Lubricating eye drops may help. See your care team if the problem does not go away or is severe. This medication may increase blood sugar. Ask your care team if changes in diet or medications are needed if you have diabetes. There have been reports of increased sexual urges or other strong urges such as gambling while taking this medication. If you experience any of these while taking this medication, you should report this to your care team as soon as possible. What side  effects may I notice from receiving this medication? Side effects that you should report to your care team as soon as possible: Allergic reactions--skin rash, itching, hives, swelling of the face, lips, tongue, or  throat High blood sugar (hyperglycemia)--increased thirst or amount of urine, unusual weakness or fatigue, blurry vision High fever, stiff muscles, increased sweating, fast or irregular heartbeat, and confusion, which may be signs of neuroleptic malignant syndrome Low blood pressure--dizziness, feeling faint or lightheaded, blurry vision Pain or trouble swallowing Prolonged or painful erection Seizures Stroke--sudden numbness or weakness of the face, arm, or leg, trouble speaking, confusion, trouble walking, loss of balance or coordination, dizziness, severe headache, change in vision Uncontrolled and repetitive body movements, muscle stiffness or spasms, tremors or shaking, loss of balance or coordination, restlessness, shuffling walk, which may be signs of extrapyramidal symptoms (EPS) Thoughts of suicide or self-harm, worsening mood, feelings of depression Urges to engage in impulsive behaviors such as gambling, binge eating, sexual activity, or shopping in ways that are unusual for you Side effects that usually do not require medical attention (report these to your care team if they continue or are bothersome): Constipation Drowsiness Weight gain This list may not describe all possible side effects. Call your doctor for medical advice about side effects. You may report side effects to FDA at 1-800-FDA-1088. Where should I keep my medication? Keep out of the reach of children and pets. Store at room temperature between 15 and 30 degrees C (59 and 86 degrees F). Throw away any unused medication after the expiration date. NOTE: This sheet is a summary. It may not cover all possible information. If you have questions about this medicine, talk to your doctor, pharmacist, or health care provider.  2024 Elsevier/Gold Standard (2022-01-30 00:00:00)

## 2023-05-24 NOTE — Progress Notes (Signed)
Psychiatric Initial Adult Assessment   Patient Identification: Benjamin Crane. MRN:  [id number] Date of Evaluation:  05/24/2023 Referral Source: Wallie Char MD Chief Complaint:   Chief Complaint  Patient presents with   Establish Care   Anxiety   Depression   Medication Refill   Drug Problem   Visit Diagnosis:    ICD-10-CM   1. Schizoaffective disorder, unspecified type (HCC)  F25.9 ARIPiprazole (ABILIFY) 5 MG tablet    TSH    Lipid panel    Hemoglobin A1C    Urine drugs of abuse scrn w alc, routine (Ref Lab)    ARIPiprazole ER (ABILIFY MAINTENA) 400 MG PRSY prefilled syringe    2. Severe stimulant use disorder (HCC)  F15.20    Unknown if in remission    3. At risk for prolonged QT interval syndrome  Z91.89 EKG 12-Lead    4. 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  Patient although pleasant through majority of the session appeared to be anxious, irritable at times.  Patient also appeared to be delusional throughout the entire session.  Patient seemed to be fixed on the fact that there is this group of people trying to steal form him.  Patient focused on getting a protective custody to keep himself and his property safe.  Patient does not believe he is psychotic, does not believe he has a diagnosis of schizophrenia or schizoaffective and even if he was diagnosed with that he does not believe it is true.  Patient believes if he can get to the bottom of who is trying to steal from him, he will be able to get a hold of his  current mental health symptoms.  Patient reports he has called law enforcement to his home in the past after he has witnessed people in his backyard.  Patient reports it was not just a 'thought', reports he could actually see them and literally 'grab them' if he wanted to.  Patient also believes since he has an inheritance that he got from his biological father and grandfather, his family is trying to take it away from him. Patient however reports he has been taking the Abilify since the past few months.  He was on Invega prior to that however Invega made him slow down.  He does not want to go back on it.  Patient is tolerating the Abilify and is okay with continuing this medication.  Patient does report anxiety mostly because office concerns about people trying to steal from him.  Patient able to elaborate further.  Patient does report sleep problems mostly because of his anxiety about people trying to steal from him.  Patient declines trial of any sleep medications today and reports he may have tried hydroxyzine and trazodone in the past and does not want to go back there.  Patient currently denies any suicidality, self-injurious thoughts.  Denies any homicidality.  Reports he will not hurt anyone else and although he knows people are trying to steal from him he does not want to hurt them  or anybody else.  He just wants to get law enforcement to help him with this.  Patient declines obtaining collateral information from his father who is here with him today.  Patient reports he does not want his father involved in his care at all.  Patient denies any history of physical/sexual trauma.  Does report emotional trauma from the 'so called group of people' as mentioned above.  Patient reports he does have a history of drug abuse-methamphetamine, cocaine in the past as noted below in substance abuse history.  He currently denies any abuse.  Patient however throughout the session appeared to be restless,  moving around a lot, anxious and possibly delusional as noted above.     Associated Signs/Symptoms: Depression Symptoms:  anxiety, (Hypo) Manic Symptoms:  Delusions, Labiality of Mood, Anxiety Symptoms:  Excessive Worry, Psychotic Symptoms:   Patient with possible delusions/paranoia as noted above PTSD Symptoms: Had a traumatic exposure:  Patient unable to elaborate further will need to explore in future sessions  Past Psychiatric History: Patient reports multiple inpatient admissions at old [facility] in the past.  I have reviewed medical records from [facility] dated 10/07/2019 - 10/15/2019-per Dr. Ellamae Sia.  Patient at that admission was diagnosed with schizoaffective disorder, MDD with psychosis, history of stimulant use.  Patient was discharged on Invega Sustenna 156 mg, risperidone 2 mg at night, Celexa 20 mg and Wellbutrin 75 mg twice a day.  Patient also was prescribed hydroxyzine as needed. Patient used to be under the care of [facility] in the past. Patient does report a previous history of being admitted to a residential substance abuse treatment program-[facility] in [place] past 6 months treatment-this may have been in 2018. Patient does report a history of suicide attempt when he threatened to kill himself with a gun however reports he was taken to the emergency department at that time. Patient also had another admission to old [facility]-January 8-13 2021.  At that time patient was on Celexa and risperidone.  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    Previous Psychotropic Medications: Yes Celexa, Hyman Bower Sustenna, risperidone, hydroxyzine, Wellbutrin  Substance Abuse History in the last 12 months: Patient reports he has a history of methamphetamine and cocaine abuse.  He may have started abusing it at the age of  37 years old.  Patient does report a history of heavy abuse however could not elaborate any further.  Reports he may have stopped abusing sometime in 2018.  However per review of medical records he may have had a history of substance abuse even after that.  As noted below patient does report a history of residential treatment for substance abuse in the past and multiple admissions to [facility]. Unknown if any pending legal issues at this time.  Consequences of Substance Abuse: Medical Consequences:  Patient with multiple admissions as well as residential treatment for substance abuse Family Consequences:  Patient currently unemployed as well as divorced   Past Medical History:  Past Medical History:  Diagnosis Date   ADHD    Anxiety    Cocaine abuse (HCC)    Depression    GERD (gastroesophageal reflux disease)     Past Surgical History:  Procedure Laterality Date   TONSILLECTOMY      Family Psychiatric History: As noted below  Family History:  Family History  Problem Relation Age of Onset   Cancer  Mother    Cancer Father    Depression Sister    Anxiety disorder Sister    Arthritis Sister    Arthritis Brother    Cancer Maternal Uncle    Heart disease Maternal Grandfather    Congestive Heart Failure Maternal Grandmother    Cancer Paternal Grandmother     Social History:   Social History   Socioeconomic History   Marital status: Divorced    Spouse name: Not on file   Number of children: 1   Years of education: Not on file   Highest education level: Some college, no degree  Occupational History   Not on file  Tobacco Use   Smoking status: Former    Types: Cigarettes   Smokeless tobacco: Never  Vaping Use   Vaping status: Every Day  Substance and Sexual Activity   Alcohol use: No   Drug use: Not Currently    Types: Cocaine, Methamphetamines    Comment: sober now   Sexual activity: Not Currently    Comment: not asked if sexually active  Other Topics Concern    Not on file  Social History Narrative   Not on file   Social Determinants of Health   Financial Resource Strain: Low Risk  (06/01/2021)   [facility], [facility]   Overall Financial Resource Strain (CARDIA)    Difficulty of Paying Living Expenses: Not hard at all  Food Insecurity: No Food Insecurity (06/01/2021)   Received from [facility], [facility]   Hunger Vital Sign    Worried About Running Out of Food in the Last Year: Never true    Ran Out of Food in the Last Year: Never true  Transportation Needs: No Transportation Needs (06/01/2021)   Received from [facility], [facility]   [facility] - Transportation    Lack of Transportation (Medical): No    Lack of Transportation (Non-Medical): No  Physical Activity: Not on file  Stress: Not on file  Social Connections: Not on file    Additional Social History: Patient was born and raised in [place].  Patient reports he was raised primarily by his mother and stepfather.  He did not know his biological father.  Patient reports he did not know his stepfather was not his biological father up until recently (unable to verify this, patient declined obtaining collateral information from his father who is here today for the appointment).  Patient graduated high school.  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  He used to work with an ambulance services in the past.  He has been unemployed since the past 1 year.  Patient was married x 1, divorced.  Patient reports he has a 26 year old daughter who is with the mother.  Patient reports he does not have any visitation rights at this time.  Patient currently lives in the same home with his father in [place].  He has a sister does not have a great relationship with her.  His brother passed away in 06-01-20.  Patient reports his mother passed away due to medical problems and 2021-06-01.  Patient could not elaborate if he had any legal issues in the past.  Needs to be explored in  future sessions.  Allergies:  No Known Allergies  Metabolic Disorder Labs: No results found for: "HGBA1C", "MPG" No results found for: "PROLACTIN" No results found for: "CHOL", "TRIG", "HDL", "CHOLHDL", "VLDL", "LDLCALC" No results found for: "TSH"  Therapeutic Level Labs: No results found  for: "LITHIUM" No results found for: "CBMZ" No results found for: "VALPROATE"  Current Medications: Current Outpatient Medications  Medication Sig Dispense Refill   acetaminophen (TYLENOL) 500 MG tablet Take by mouth as needed.     ARIPiprazole (ABILIFY) 5 MG tablet Take 1 tablet (5 mg total) by mouth in the morning. 30 tablet 0   ARIPiprazole ER (ABILIFY MAINTENA) 400 MG PRSY prefilled syringe Inject 400 mg into the muscle every 28 (twenty-eight) days. 1 each 4   pantoprazole (PROTONIX) 20 MG tablet Take by mouth once.     sulfamethoxazole-trimethoprim (BACTRIM DS) 800-160 MG tablet Take 2 tablets by mouth 2 (two) times daily for 7 days. 28 tablet 0   No current facility-administered medications for this visit.    Musculoskeletal: Strength & Muscle Tone: within normal limits Gait & Station: normal Patient leans: N/A  Psychiatric Specialty Exam: Review of Systems  Psychiatric/Behavioral:  Positive for sleep disturbance. The patient is nervous/anxious.     Blood pressure 119/72, pulse 77, temperature 97.8 F (36.6 C), temperature source Skin, height 5\' 11"  (1.803 m), weight 160 lb (72.6 kg).Body mass index is 22.32 kg/m.  General Appearance: Casual  Eye Contact:  Minimal  Speech:  Clear and Coherent  Volume:  Normal  Mood:  Anxious and Irritable  Affect:  Congruent  Thought Process:  Linear and Descriptions of Associations: Circumstantial  Orientation:  Full (Time, Place, and Person)  Thought Content:  Delusions, Paranoid Ideation, and Rumination  Suicidal Thoughts:  No  Homicidal Thoughts:  No  Memory:  Immediate;   Fair Recent;   Fair Remote;   Fair  Judgement:  Fair  Insight:   Shallow  Psychomotor Activity:  Increased and Restlessness  Concentration:  Concentration: Fair and Attention Span: Fair  Recall:  [employer] of Knowledge:Fair  Language: Fair  Akathisia:  No  Handed:  Right  AIMS (if indicated):  done  Assets:  Desire for Improvement Transportation  ADL's:  Intact  Cognition: WNL  Sleep:  Poor   Screenings: AIMS    Flowsheet Row Office Visit from 05/24/2023 in [facility]  AIMS Total Score 0      GAD-7    Flowsheet Row Office Visit from 05/24/2023 in [facility]  Total GAD-7 Score 1      PHQ2-9    Flowsheet Row Office Visit from 05/24/2023 in [facility] Office Visit from 03/04/2023 in [facility] Office Visit from 02/28/2023 in [facility] Office Visit from 07/10/2021 in [facility] Office Visit from 11/25/2020 in [facility]  PHQ-2 Total Score 0 0 0 3 3  PHQ-9 Total Score 2 0 1 10 10       Flowsheet Row Office Visit from 05/24/2023 in [facility] ED from 05/18/2023 in [facility] Department at [facility] ED from 04/01/2022 in [facility] Department at [facility]  C-SSRS RISK CATEGORY Moderate Risk No Risk No Risk       Assessment and Plan: Benjamin Crane. Is a 37 year old Caucasian male, with a history of substance abuse, possibly methamphetamine and cocaine with previous diagnosis of schizoaffective disorder, major depression, was evaluated in office today to establish care.  Patient currently on a low dosage of Abilify, reports noncompliance on and off, will benefit from readjusting the dosage of Abilify as well as starting an LAI like Abilify  Maintena.  Patient will also benefit from labs including a urine drug screen (due to hx of  substance abuse) as well as EKG since he is on medications like Abilify.  Collateral information could not be obtained from family members, father who brought patient to the appointment since patient declined.  Collateral information was obtained by review of medical records from most recent hospital stay at [facility] Discussed plan as noted below.  Plan  Schizoaffective disorder-unstable Will increase Abilify to 5 mg p.o. daily. Plan to start LAI like Abilify Maintena 400 mg IM every 28 days.  I have sent a prescription for the same to his pharmacy to see if this is affordable with his current health insurance plan.  If it is not affordable patient advised to reach out to [place] for further discussion and medication changes. Discussed establishing care with a therapist, provided resources in the community. Discussed addition of hydroxyzine as needed, patient declined. Discussed trial of a sleep medication, patient declined.  Severe stimulant use disorder-unknown if in remission-unstable Will get urine drug screen today.  Patient advised to go to [facility] lab.  At risk for prolonged QT syndrome-we will order EKG.  Patient to call [phone].  High risk medication use-will order labs including UDS,prolactin level, hemoglobin A1c, lipid panel, TSH-patient to go to [facility] lab.  I have reviewed notes per [facility] , Dr.Reddy - Jan 2021 as well as March 2021 -admissions as noted above in past psychiatric history.  We will consider obtaining records from [employer].  Patient to sign an ROI.  I have also reviewed notes per emergency department visits-most recent at [facility]-04[facility]01/2022. Patient was IVC ed for agitation at that time.  Most recent urine drug screen-reviewed 04/01/2022-negative. 2 years ago-positive for amphetamines.  Collaboration of Care: Referral or follow-up with counselor/therapist AEB patient encouraged to establish care with  therapist.  Patient/Guardian was advised Release of Information must be obtained prior to any record release in order to collaborate their care with an outside provider. Patient/Guardian was advised if they have not already done so to contact the registration department to sign all necessary forms in order for [place] to release information regarding their care.   Consent: Patient/Guardian gives verbal consent for treatment and assignment of benefits for services provided during this visit. Patient/Guardian expressed understanding and agreed to proceed.   I have spent atleast 60 minutes face to face with patient today which includes the time spent for preparing to see the patient ( e.g., review of test, records ), obtaining and to review and separately obtained history , ordering medications and test ,psychoeducation and supportive psychotherapy and care coordination,as well as documenting clinical information in electronic health record,interpreting and communication of test results  This note was generated in part or whole with voice recognition software. Voice recognition is usually quite accurate but there are transcription errors that can and very often do occur. I apologize for any typographical errors that were not detected and corrected.    Jomarie Longs, MD 10/29/20242:23 PM

## 2023-05-24 NOTE — Telephone Encounter (Signed)
went online to submit the prior auth. - pending

## 2023-05-24 NOTE — Telephone Encounter (Signed)
received fax that a prior auth was needed for the abilify maintena.

## 2023-05-25 NOTE — Telephone Encounter (Signed)
received a denial notice. please review the letter that is transfered to the media tab in chart review.

## 2023-05-26 NOTE — Telephone Encounter (Signed)
Noted  

## 2023-05-30 ENCOUNTER — Other Ambulatory Visit
Admission: RE | Admit: 2023-05-30 | Discharge: 2023-05-30 | Disposition: A | Discharge status: Home / Self Care | Payer: 59 | Attending: Psychiatry | Admitting: Psychiatry

## 2023-05-30 DIAGNOSIS — F259 Schizoaffective disorder, unspecified: Secondary | ICD-10-CM | POA: Diagnosis not present

## 2023-05-30 DIAGNOSIS — Z79899 Other long term (current) drug therapy: Secondary | ICD-10-CM | POA: Diagnosis not present

## 2023-05-30 LAB — LIPID PANEL
Cholesterol: 198 mg/dL (ref 0–200)
HDL: 47 mg/dL (ref >40)
LDL Cholesterol: 101 mg/dL — ABNORMAL HIGH (ref 0–99)
Total CHOL/HDL Ratio: 4.2 {ratio}
Triglycerides: 249 mg/dL — ABNORMAL HIGH (ref <150)
VLDL: 50 mg/dL — ABNORMAL HIGH (ref 0–40)

## 2023-05-30 LAB — HEMOGLOBIN A1C
Hgb A1c MFr Bld: 5.2 % (ref 4.8–5.6)
Mean Plasma Glucose: 102.54 mg/dL

## 2023-05-30 LAB — TSH: TSH: 1.378 u[IU]/mL (ref 0.350–4.500)

## 2023-05-31 LAB — PROLACTIN: Prolactin: 4.8 ng/mL (ref 3.9–22.7)

## 2023-06-02 ENCOUNTER — Telehealth: Payer: Self-pay | Admitting: Psychiatry

## 2023-06-02 DIAGNOSIS — F19959 Other psychoactive substance use, unspecified with psychoactive substance-induced psychotic disorder, unspecified: Secondary | ICD-10-CM

## 2023-06-02 DIAGNOSIS — F1520 Other stimulant dependence, uncomplicated: Secondary | ICD-10-CM

## 2023-06-02 LAB — AMPHETAMINE CONF, UR
Amphetamine GC/MS Conf: >3000 ng/mL
Amphetamine, Ur: POSITIVE — AB
Amphetamines: POSITIVE — AB
Methamphetamine Quant, Ur: >3000 ng/mL
Methamphetamine, Ur: POSITIVE — AB

## 2023-06-02 LAB — URINE DRUGS OF ABUSE SCREEN W ALC, ROUTINE (REF LAB)
Barbiturate, Ur: NEGATIVE ng/mL
Benzodiazepine Quant, Ur: NEGATIVE ng/mL
Cannabinoid Quant, Ur: NEGATIVE ng/mL
Cocaine (Metab.): NEGATIVE ng/mL
Ethanol U, Quan: NEGATIVE %
Methadone Screen, Urine: NEGATIVE ng/mL
Opiate Quant, Ur: NEGATIVE ng/mL
Phencyclidine, Ur: NEGATIVE ng/mL
Propoxyphene, Urine: NEGATIVE ng/mL

## 2023-06-02 NOTE — Telephone Encounter (Signed)
Attempted to contact this patient to discuss urine drug screen positive for amphetamines.  That likely explains as psychosis/mood problems.  Patient will benefit from treatment for substance use disorder.  Attempted to contact patient to discuss this however patient did not respond.  I have left a voicemail.

## 2023-06-02 NOTE — Telephone Encounter (Signed)
Attempted to contact patient to discuss results of his urine drug screen. Had to leave a voicemail.

## 2023-06-28 ENCOUNTER — Encounter: Payer: Self-pay | Admitting: Psychiatry

## 2023-06-28 ENCOUNTER — Ambulatory Visit (INDEPENDENT_AMBULATORY_CARE_PROVIDER_SITE_OTHER): Payer: 59 | Admitting: Psychiatry

## 2023-06-28 VITALS — BP 114/83 | HR 102 | Temp 98.1°F | Ht 71.0 in | Wt 162.0 lb

## 2023-06-28 DIAGNOSIS — F1520 Other stimulant dependence, uncomplicated: Secondary | ICD-10-CM

## 2023-06-28 DIAGNOSIS — F19959 Other psychoactive substance use, unspecified with psychoactive substance-induced psychotic disorder, unspecified: Secondary | ICD-10-CM | POA: Diagnosis not present

## 2023-06-28 NOTE — Patient Instructions (Addendum)
[facility] - Substance treatment and recovery ( [facility] ) - CALL [phone]   [email or web address] [address], [place], [place] [address]   [phone]  [facility] Counseling & mental health in [place], [place] Website Directions Contact us [address], [place], [place] [address]   [phone]   [facility], call: [phone]   [facility] Alcohol [facility] treatment center in [place], [place] Contact us [address] [place], [place] [address]   [phone]   [email or web address] [employer] [address] [place], [place], [place] [address]  [phone]  Amphetamine Use Disorder Amphetamine use disorder is a condition in which the use of amphetamines disrupts daily life. Amphetamines belong to a group of powerful drugs known as stimulants. Common names for amphetamines include speed and crank. Amphetamines have many medical uses, but these drugs are often misused because of their effects. These effects include: A feeling of extreme pleasure (euphoria). Alertness or increased attention. A high energy level. Loss of appetite. Misuse of amphetamines can cause problems with physical and mental health, including: Unplanned weight loss. Diseases caused by infections, such as hepatitis or HIV. Anxiety, depression, mood swings, or sleep problems. Violent behavior. Amphetamine use disorder can be dangerous and life-threatening. These drugs increase your blood pressure and heart rate, which can lead to a heart attack or stroke. Amphetamines can also make your heart rate irregular, cause seizures, and raise your body temperature. What are the causes? This condition is caused by misusing amphetamines for a period of time, such as by taking them for reasons other than to treat a diagnosed problem. Many people start using amphetamines because these drugs make them feel good. Over  time, they get addicted to them.  What increases the risk? This condition is more likely to develop in people who: Misuse other drugs or alcohol. Have problems with mood or behavior. Have a mental illness, such as depression, post-traumatic stress disorder, or antisocial personality disorder. Begin use at an early age, such as during their teenage years. What are the signs or symptoms? Symptoms of this condition include: Using amphetamines for longer periods of time or in higher amounts than you want to. Craving amphetamines and being unable to slow down or stop using the drug. Spending a lot of time getting amphetamines, using them, or recovering from their effects. Using amphetamines in a way that interferes with work, school, social activities, or personal relationships. Giving up or cutting down on important life activities because of amphetamine use. Using amphetamines when it is dangerous, such as when driving a car. Continuing to use the drug even after it has led to problems such as: Physical or mental health problems. Legal or financial troubles. Job loss. Broken relationships. Needing more and more of the drug to get the same effect (building up a tolerance). Having unpleasant symptoms if you do not use the amphetamine (withdrawal). Some symptoms of withdrawal include: Fast or irregular heartbeats (palpitations). Chest pain. Nausea, vomiting, diarrhea, or abdominal cramping. Restlessness and mood changes. Tiredness (fatigue). Sleep changes or bad dreams. Increased appetite. How is this diagnosed? This condition is diagnosed based on: A physical exam. Your history of amphetamine use. Your symptoms. This includes: How amphetamine use affects your life. Changes in personality, behaviors, and mood. Having at least two symptoms of amphetamine use disorder within a 59-month period. How severe the condition is depends on how many symptoms you have. Health issues related  to  using amphetamines. Blood or urine tests to screen for drugs. How is this treated? The first goal of treatment is to stop your use of amphetamines. This must be done safely and may involve: Taking part in group and individual counseling from specially trained mental health providers. Staying at a residential treatment center for several days or weeks. Attending daily counseling sessions at a treatment center. Taking medicines as told by your health care provider that: Ease symptoms and prevent complications during withdrawal. Block cravings and block the good feeling that you get from using amphetamines. Treat other mental health issues, such as depression or anxiety. Reduce agitation. Participating in a support group to share your experience with others who are going through the same thing. Recovery can be a long process. Many people who undergo treatment start using the drug again after stopping (relapse). If you relapse, it does not mean that treatment will not work. Follow these instructions at home: Medicines Take over-the-counter and prescription medicines only as told by your health care provider. Check with your health care provider before starting any new medicines, vitamins, herbs, or supplements. General instructions Do not use any drugs or alcohol. Do not use any products that contain nicotine or tobacco. These products include cigarettes, chewing tobacco, and vaping devices, such as e-cigarettes. If you need help quitting, ask your health care provider. Avoid people and activities that trigger your use of amphetamines. Learn and practice techniques for managing stress. Have a plan for vulnerable moments. Get phone numbers of those who are willing to help and who are committed to your recovery. Attend support groups regularly. These groups provide emotional support, advice, and guidance. Keep all follow-up visits. This is important for recovery and includes continuing to work  with therapists and support groups. Where to find more information [employer] on Drug Abuse: SkinCoat.nl Substance Abuse and Mental Health Services Administration: RockToxic.pl Narcotics Anonymous: DestructiveBlog.cz Contact a health care provider if: Your symptoms get worse. You use amphetamines again. You cannot take medicines as told. Get help right away if: You may have taken too much of an amphetamine (overdosed). Symptoms of an overdose include: Chest pain. A seizure. You have any symptoms of a stroke. "BE FAST" is an easy way to remember the main warning signs of a stroke: B - Balance. Signs are dizziness, sudden trouble walking, or loss of balance. E - Eyes. Signs are trouble seeing or a sudden change in vision. F - Face. Signs are sudden weakness or numbness of the face, or the face or eyelid drooping on one side. A - Arms. Signs are weakness or numbness in an arm. This happens suddenly and usually on one side of the body. S - Speech. Signs are sudden trouble speaking, slurred speech, or trouble understanding what people say. T - Time. Time to call emergency services. Write down what time symptoms started. You have other signs of a stroke, such as: A sudden, severe headache with no known cause. These symptoms may be an emergency. Get help right away. Call 911. Do not wait to see if the symptoms will go away. Do not drive yourself to the hospital. Also, get help right away if: You have serious thoughts about hurting yourself or others. Take one of these steps if you feel like you may hurt yourself or others, or have thoughts about taking your own life: Go to your nearest emergency room. Call 911. Call the National Suicide Prevention Lifeline at 917-832-1060 or 988. This is open 24  hours a day. Text the Crisis Text Line at (231) 733-7221. Summary Amphetamine use disorder is a condition in which the use of amphetamines disrupts daily life. Amphetamines belong to a group of powerful drugs  known as stimulants. Avoid people and activities that trigger your use of amphetamines, and be sure to attend support groups regularly. Recovery can be a long process. If you have a relapse, that does not mean that treatment will not work. This information is not intended to replace advice given to you by your health care provider. Make sure you discuss any questions you have with your health care provider. Document Revised: 11/09/2021 Document Reviewed: 11/09/2021 Elsevier Patient Education  2024 [employer].
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
## 2023-07-30 DIAGNOSIS — F32A Depression, unspecified: Secondary | ICD-10-CM | POA: Diagnosis not present

## 2023-07-30 DIAGNOSIS — W2209XA Striking against other stationary object, initial encounter: Secondary | ICD-10-CM | POA: Diagnosis not present

## 2023-07-30 DIAGNOSIS — M79641 Pain in right hand: Secondary | ICD-10-CM | POA: Diagnosis not present

## 2023-07-30 DIAGNOSIS — Z87891 Personal history of nicotine dependence: Secondary | ICD-10-CM | POA: Diagnosis not present

## 2023-07-30 DIAGNOSIS — S6991XA Unspecified injury of right wrist, hand and finger(s), initial encounter: Secondary | ICD-10-CM | POA: Diagnosis not present

## 2023-09-19 DIAGNOSIS — Z87891 Personal history of nicotine dependence: Secondary | ICD-10-CM | POA: Diagnosis not present

## 2023-09-19 DIAGNOSIS — N50819 Testicular pain, unspecified: Secondary | ICD-10-CM | POA: Diagnosis not present

## 2023-09-19 DIAGNOSIS — N5082 Scrotal pain: Secondary | ICD-10-CM | POA: Diagnosis not present

## 2023-09-19 DIAGNOSIS — F32A Depression, unspecified: Secondary | ICD-10-CM | POA: Diagnosis not present

## 2023-09-19 DIAGNOSIS — N5089 Other specified disorders of the male genital organs: Secondary | ICD-10-CM | POA: Diagnosis not present
